# Patient Record
Sex: Female | Born: 1945 | Race: Asian | Hispanic: No | Marital: Married | Smoking: Never smoker
Health system: Southern US, Community
[De-identification: ages and names within clinical notes are randomized; demographics above are authoritative.]

## PROBLEM LIST (undated history)

## (undated) DIAGNOSIS — I10 Essential (primary) hypertension: Secondary | ICD-10-CM

## (undated) HISTORY — DX: Essential (primary) hypertension: I10

---

## 1998-07-28 ENCOUNTER — Ambulatory Visit (HOSPITAL_COMMUNITY): Admission: RE | Admit: 1998-07-28 | Discharge: 1998-07-28 | Payer: Self-pay | Admitting: Obstetrics and Gynecology

## 2000-07-25 ENCOUNTER — Encounter: Payer: Self-pay | Admitting: Obstetrics and Gynecology

## 2000-07-25 ENCOUNTER — Ambulatory Visit (HOSPITAL_COMMUNITY): Admission: RE | Admit: 2000-07-25 | Discharge: 2000-07-25 | Payer: Self-pay | Admitting: Obstetrics and Gynecology

## 2001-08-21 ENCOUNTER — Encounter: Payer: Self-pay | Admitting: Obstetrics and Gynecology

## 2001-08-21 ENCOUNTER — Ambulatory Visit (HOSPITAL_COMMUNITY): Admission: RE | Admit: 2001-08-21 | Discharge: 2001-08-21 | Payer: Self-pay | Admitting: Obstetrics and Gynecology

## 2003-03-18 ENCOUNTER — Encounter: Payer: Self-pay | Admitting: Obstetrics and Gynecology

## 2003-03-18 ENCOUNTER — Ambulatory Visit (HOSPITAL_COMMUNITY): Admission: RE | Admit: 2003-03-18 | Discharge: 2003-03-18 | Payer: Self-pay | Admitting: Obstetrics and Gynecology

## 2003-03-26 ENCOUNTER — Other Ambulatory Visit: Admission: RE | Admit: 2003-03-26 | Discharge: 2003-03-26 | Payer: Self-pay | Admitting: Gynecology

## 2004-07-14 ENCOUNTER — Ambulatory Visit (HOSPITAL_COMMUNITY): Admission: RE | Admit: 2004-07-14 | Discharge: 2004-07-14 | Payer: Self-pay | Admitting: Family Medicine

## 2005-08-30 ENCOUNTER — Ambulatory Visit (HOSPITAL_COMMUNITY): Admission: RE | Admit: 2005-08-30 | Discharge: 2005-08-30 | Payer: Self-pay | Admitting: Family Medicine

## 2005-09-06 ENCOUNTER — Other Ambulatory Visit: Admission: RE | Admit: 2005-09-06 | Discharge: 2005-09-06 | Payer: Self-pay | Admitting: Obstetrics and Gynecology

## 2006-09-12 ENCOUNTER — Ambulatory Visit (HOSPITAL_COMMUNITY): Admission: RE | Admit: 2006-09-12 | Discharge: 2006-09-12 | Payer: Self-pay | Admitting: Family Medicine

## 2006-09-26 ENCOUNTER — Encounter: Admission: RE | Admit: 2006-09-26 | Discharge: 2006-09-26 | Payer: Self-pay | Admitting: Otolaryngology

## 2007-10-23 ENCOUNTER — Ambulatory Visit (HOSPITAL_COMMUNITY): Admission: RE | Admit: 2007-10-23 | Discharge: 2007-10-23 | Payer: Self-pay | Admitting: Family Medicine

## 2007-10-24 ENCOUNTER — Other Ambulatory Visit: Admission: RE | Admit: 2007-10-24 | Discharge: 2007-10-24 | Payer: Self-pay | Admitting: Obstetrics and Gynecology

## 2007-11-04 ENCOUNTER — Encounter: Admission: RE | Admit: 2007-11-04 | Discharge: 2007-11-04 | Payer: Self-pay | Admitting: Family Medicine

## 2008-06-17 ENCOUNTER — Encounter: Admission: RE | Admit: 2008-06-17 | Discharge: 2008-08-04 | Payer: Self-pay | Admitting: Orthopedic Surgery

## 2008-12-02 ENCOUNTER — Ambulatory Visit (HOSPITAL_COMMUNITY): Admission: RE | Admit: 2008-12-02 | Discharge: 2008-12-02 | Payer: Self-pay | Admitting: Family Medicine

## 2009-09-15 ENCOUNTER — Ambulatory Visit: Payer: Self-pay | Admitting: Women's Health

## 2009-09-15 ENCOUNTER — Other Ambulatory Visit: Admission: RE | Admit: 2009-09-15 | Discharge: 2009-09-15 | Payer: Self-pay | Admitting: Gynecology

## 2009-12-14 ENCOUNTER — Ambulatory Visit (HOSPITAL_COMMUNITY): Admission: RE | Admit: 2009-12-14 | Discharge: 2009-12-14 | Payer: Self-pay | Admitting: Family Medicine

## 2010-10-30 ENCOUNTER — Encounter: Payer: Self-pay | Admitting: Family Medicine

## 2010-12-21 ENCOUNTER — Other Ambulatory Visit (HOSPITAL_COMMUNITY): Payer: Self-pay | Admitting: Family Medicine

## 2010-12-21 DIAGNOSIS — Z1231 Encounter for screening mammogram for malignant neoplasm of breast: Secondary | ICD-10-CM

## 2010-12-23 ENCOUNTER — Encounter (INDEPENDENT_AMBULATORY_CARE_PROVIDER_SITE_OTHER): Payer: Medicare PPO | Admitting: Women's Health

## 2010-12-23 DIAGNOSIS — N951 Menopausal and female climacteric states: Secondary | ICD-10-CM

## 2011-01-03 ENCOUNTER — Ambulatory Visit (HOSPITAL_COMMUNITY): Payer: Medicare PPO

## 2011-01-05 ENCOUNTER — Ambulatory Visit (HOSPITAL_COMMUNITY)
Admission: RE | Admit: 2011-01-05 | Discharge: 2011-01-05 | Disposition: A | Discharge status: Home / Self Care | Payer: Medicare PPO | Source: Ambulatory Visit | Attending: Family Medicine | Admitting: Family Medicine

## 2011-01-05 DIAGNOSIS — Z1231 Encounter for screening mammogram for malignant neoplasm of breast: Secondary | ICD-10-CM | POA: Insufficient documentation

## 2011-12-28 ENCOUNTER — Other Ambulatory Visit (HOSPITAL_COMMUNITY): Payer: Self-pay | Admitting: Family Medicine

## 2011-12-28 DIAGNOSIS — Z1231 Encounter for screening mammogram for malignant neoplasm of breast: Secondary | ICD-10-CM

## 2012-01-25 ENCOUNTER — Ambulatory Visit (HOSPITAL_COMMUNITY)
Admission: RE | Admit: 2012-01-25 | Discharge: 2012-01-25 | Disposition: A | Discharge status: Home / Self Care | Payer: Medicare PPO | Source: Ambulatory Visit | Attending: Family Medicine | Admitting: Family Medicine

## 2012-01-25 DIAGNOSIS — Z1231 Encounter for screening mammogram for malignant neoplasm of breast: Secondary | ICD-10-CM | POA: Insufficient documentation

## 2013-01-14 ENCOUNTER — Other Ambulatory Visit (HOSPITAL_COMMUNITY): Payer: Self-pay | Admitting: Family Medicine

## 2013-01-14 DIAGNOSIS — Z1231 Encounter for screening mammogram for malignant neoplasm of breast: Secondary | ICD-10-CM

## 2013-02-04 ENCOUNTER — Ambulatory Visit (HOSPITAL_COMMUNITY): Payer: Medicare PPO

## 2013-02-20 ENCOUNTER — Ambulatory Visit (HOSPITAL_COMMUNITY)
Admission: RE | Admit: 2013-02-20 | Discharge: 2013-02-20 | Disposition: A | Discharge status: Home / Self Care | Payer: Medicare PPO | Source: Ambulatory Visit | Attending: Family Medicine | Admitting: Family Medicine

## 2013-02-20 DIAGNOSIS — Z1231 Encounter for screening mammogram for malignant neoplasm of breast: Secondary | ICD-10-CM | POA: Insufficient documentation

## 2014-03-05 ENCOUNTER — Other Ambulatory Visit (HOSPITAL_COMMUNITY): Payer: Self-pay | Admitting: Family Medicine

## 2014-03-05 DIAGNOSIS — Z1231 Encounter for screening mammogram for malignant neoplasm of breast: Secondary | ICD-10-CM

## 2014-03-17 ENCOUNTER — Ambulatory Visit (HOSPITAL_COMMUNITY): Payer: Medicare PPO

## 2014-04-07 ENCOUNTER — Ambulatory Visit (HOSPITAL_COMMUNITY)
Admission: RE | Admit: 2014-04-07 | Discharge: 2014-04-07 | Disposition: A | Discharge status: Home / Self Care | Payer: Medicare PPO | Source: Ambulatory Visit | Attending: Family Medicine | Admitting: Family Medicine

## 2014-04-07 DIAGNOSIS — Z1231 Encounter for screening mammogram for malignant neoplasm of breast: Secondary | ICD-10-CM | POA: Insufficient documentation

## 2015-04-26 ENCOUNTER — Other Ambulatory Visit (HOSPITAL_COMMUNITY): Payer: Self-pay | Admitting: Family Medicine

## 2015-04-26 DIAGNOSIS — Z1231 Encounter for screening mammogram for malignant neoplasm of breast: Secondary | ICD-10-CM

## 2015-05-11 ENCOUNTER — Ambulatory Visit (HOSPITAL_COMMUNITY)
Admission: RE | Admit: 2015-05-11 | Discharge: 2015-05-11 | Disposition: A | Discharge status: Home / Self Care | Payer: Medicare PPO | Source: Ambulatory Visit | Attending: Family Medicine | Admitting: Family Medicine

## 2015-05-11 DIAGNOSIS — Z1231 Encounter for screening mammogram for malignant neoplasm of breast: Secondary | ICD-10-CM | POA: Insufficient documentation

## 2015-06-01 DIAGNOSIS — I10 Essential (primary) hypertension: Secondary | ICD-10-CM | POA: Diagnosis not present

## 2015-06-01 DIAGNOSIS — E782 Mixed hyperlipidemia: Secondary | ICD-10-CM | POA: Diagnosis not present

## 2015-07-06 DIAGNOSIS — R7309 Other abnormal glucose: Secondary | ICD-10-CM | POA: Diagnosis not present

## 2015-12-29 ENCOUNTER — Ambulatory Visit (INDEPENDENT_AMBULATORY_CARE_PROVIDER_SITE_OTHER): Payer: Medicare PPO | Admitting: Family Medicine

## 2015-12-29 ENCOUNTER — Encounter: Payer: Self-pay | Admitting: Family Medicine

## 2015-12-29 ENCOUNTER — Other Ambulatory Visit (INDEPENDENT_AMBULATORY_CARE_PROVIDER_SITE_OTHER): Payer: Medicare PPO

## 2015-12-29 VITALS — BP 128/82 | HR 62 | Wt 121.0 lb

## 2015-12-29 DIAGNOSIS — M7551 Bursitis of right shoulder: Secondary | ICD-10-CM | POA: Diagnosis not present

## 2015-12-29 DIAGNOSIS — M25511 Pain in right shoulder: Secondary | ICD-10-CM

## 2015-12-29 NOTE — Patient Instructions (Signed)
Good to see you.  Ice 20 minutes 2 times daily. Usually after activity and before bed. Exercises 3 times a week.  pennsaid pinkie amount topically 2 times daily as needed.  Avoid activity with hand out of peripheral vision  Vitamin D 2000 IU daily  Continue the turmeric See me again in 3-4 weeks and if not better we will try ultrasound and injection or physical therapy  Tell your husband hello!

## 2015-12-29 NOTE — Assessment & Plan Note (Signed)
Patient does have bursitis of the right shoulder. We discussed icing regimen, home exercises, patient work with Event organiserathletic trainer. Patient even trial topical anti-inflammatory's. We discussed which activities to avoid. Patient and will come back and see me again in 3-4 weeks. At that time if worsening symptoms we'll consider ultrasound as well as possible injection. Patient would also be a candidate for formal physical therapy.

## 2015-12-29 NOTE — Progress Notes (Signed)
Pre visit review using our clinic review tool, if applicable. No additional management support is needed unless otherwise documented below in the visit note. 

## 2015-12-29 NOTE — Progress Notes (Signed)
Patricia Nielsen D.O. [facility] [address] [place], [place] [address] Phone: [phone] Subjective:     CC: right shoulder pain  BJY:NWGNFAOZHYHPI:Subjective Patricia Nielsen is a 70 y.o. female coming in with complaint of complaint of right shoulder pain. Patient describes it as a dull, throbbing aching sensation that is been going on for 3-4 months. Patient states she does not remember any true injury. States that reaching behind her back seems to be the most painful. Can wake her up at night. Mild radiation down the arm. No associated neck pain. No significant weakness. Patient rates the severity of pain as 4 out of 10. Patient is concerned because is seems to be worsening and not getting better. Has not responded well to any home modalities.     No past medical history on file.no history of any orthopedic complaints, I cholesterol, hypertension No past surgical history on file. Social History   Social History  . Marital Status: Married    Spouse Name: N/A  . Number of Children: N/A  . Years of Education: N/A   Social History Main Topics  . Smoking status: Never Smoker   . Smokeless tobacco: None  . Alcohol Use: None  . Drug Use: None  . Sexual Activity: Not Asked   Other Topics Concern  . None   Social History Narrative  . None   Not on File NKDA No family history on file. no family history of rheumatological diseases Past medical history, social, surgical and family history all reviewed in electronic medical record.  No pertanent information unless stated regarding to the chief complaint.   Review of Systems: No headache, visual changes, nausea, vomiting, diarrhea, constipation, dizziness, abdominal pain, skin rash, fevers, chills, night sweats, weight loss, swollen lymph nodes, body aches, joint swelling, muscle aches, chest pain, shortness of breath, mood changes.   Objective Blood pressure 128/82, pulse 62, weight 121 lb (54.885 kg), SpO2 97 %.  General: No apparent  distress alert and oriented x3 mood and affect normal, dressed appropriately.  HEENT: Pupils equal, extraocular movements intact  Respiratory: Patient's speak in full sentences and does not appear short of breath  Cardiovascular: No lower extremity edema, non tender, no erythema  Skin: Warm dry intact with no signs of infection or rash on extremities or on axial skeleton.  Abdomen: Soft nontender  Neuro: Cranial nerves II through XII are intact, neurovascularly intact in all extremities with 2+ DTRs and 2+ pulses.  Lymph: No lymphadenopathy of posterior or anterior cervical chain or axillae bilaterally.  Gait normal with good balance and coordination.  MSK:  Non tender with full range of motion and good stability and symmetric strength and tone of , elbows, wrist, hip, knee and ankles bilaterally.  Shoulder: Right Inspection reveals no abnormalities, atrophy or asymmetry. Palpation is normal with no tenderness over AC joint or bicipital groove. ROM is full in all planes passively. Rotator cuff strength normal throughout. signs of impingement with positive Neer and Hawkin's tests, but negative empty can sign. Speeds and Yergason's tests normal. No labral pathology noted with negative Obrien's, negative clunk and good stability. Normal scapular function observed. No painful arc and no drop arm sign. No apprehension sign  Contralateral shoulder unremarkable  Procedure note 97110; 15 minutes spent for Therapeutic exercises as stated in above notes.  This included exercises focusing on stretching, strengthening, with significant focus on eccentric aspects.  Basic scapular stabilization to include adduction and depression of scapula Scaption, focusing on proper movement  and good control Internal and External rotation utilizing a theraband, with elbow tucked at side entire time Rows with theraba Proper technique shown and discussed handout in great detail with ATC.  All questions were discussed  and answered.     Impression and Recommendations:     This case required medical decision making of moderate complexity.      Note: This dictation was prepared with Dragon dictation along with smaller phrase technology. Any transcriptional errors that result from this process are unintentional.

## 2016-01-26 ENCOUNTER — Ambulatory Visit (INDEPENDENT_AMBULATORY_CARE_PROVIDER_SITE_OTHER): Payer: Medicare PPO | Admitting: Family Medicine

## 2016-01-26 ENCOUNTER — Encounter: Payer: Self-pay | Admitting: Family Medicine

## 2016-01-26 VITALS — BP 122/78 | HR 59 | Wt 122.0 lb

## 2016-01-26 DIAGNOSIS — M7551 Bursitis of right shoulder: Secondary | ICD-10-CM

## 2016-01-26 NOTE — Patient Instructions (Signed)
Good to see you  Ice 20 minutes 2 times daily. Usually after activity and before bed. Continue to move but no heavy lifting.  Try the pennsaid again up to 2 times a day  Continue the vitamins Physical therapy will be calling you  See me again in 4-6 weeks.

## 2016-01-26 NOTE — Progress Notes (Signed)
  Tawana ScaleZach Smith D.O. [facility] [address] [place], [place] [address] Phone: [phone]) [phone] Subjective:     CC: right shoulder pain  BJY:NWGNFAOZHYHPI:Subjective Patricia Nielsen is a 70 y.o. female coming in with complaint of complaint of right shoulder pain. Patient was found to have more of a shoulder bursitis. Patient encouraged to do home exercises, icing, topical anti-inflammatories as well as over-the-counter vitamins. Patient has been doing [place] fairly frequently. Has noticed some mild improvement. States that she is having more good days than bad days. Seems to feel better when she is doing activities and when she is sitting around. Denies as much nighttime awakening.     No past medical history on file.no history of any orthopedic complaints, I cholesterol, hypertension No past surgical history on file. Social History   Social History  . Marital Status: Married    Spouse Name: N/A  . Number of Children: N/A  . Years of Education: N/A   Social History Main Topics  . Smoking status: Never Smoker   . Smokeless tobacco: None  . Alcohol Use: None  . Drug Use: None  . Sexual Activity: Not Asked   Other Topics Concern  . None   Social History Narrative   Not on File NKDA No family history on file. no family history of rheumatological diseases Past medical history, social, surgical and family history all reviewed in electronic medical record.  No pertanent information unless stated regarding to the chief complaint.   Review of Systems: No headache, visual changes, nausea, vomiting, diarrhea, constipation, dizziness, abdominal pain, skin rash, fevers, chills, night sweats, weight loss, swollen lymph nodes, body aches, joint swelling, muscle aches, chest pain, shortness of breath, mood changes.   Objective Blood pressure 122/78, pulse 59, weight 122 lb (55.339 kg), SpO2 97 %.  General: No apparent distress alert and oriented x3 mood and affect normal, dressed appropriately.    HEENT: Pupils equal, extraocular movements intact  Respiratory: Patient's speak in full sentences and does not appear short of breath  Cardiovascular: No lower extremity edema, non tender, no erythema  Skin: Warm dry intact with no signs of infection or rash on extremities or on axial skeleton.  Abdomen: Soft nontender  Neuro: Cranial nerves II through XII are intact, neurovascularly intact in all extremities with 2+ DTRs and 2+ pulses.  Lymph: No lymphadenopathy of posterior or anterior cervical chain or axillae bilaterally.  Gait normal with good balance and coordination.  MSK:  Non tender with full range of motion and good stability and symmetric strength and tone of , elbows, wrist, hip, knee and ankles bilaterally.  Shoulder: Right Inspection reveals no abnormalities, atrophy or asymmetry. Palpation is normal with no tenderness over AC joint or bicipital groove. ROM is full in all planes passively. Rotator cuff strength normal throughout. Mild impingement still remaining. Mild improvement from previous exam Speeds and Yergason's tests normal. No labral pathology noted with negative Obrien's, negative clunk and good stability. Normal scapular function observed. No painful arc and no drop arm sign. No apprehension sign  Contralateral shoulder unremarkable       Impression and Recommendations:     This case required medical decision making of moderate complexity.      Note: This dictation was prepared with Dragon dictation along with smaller phrase technology. Any transcriptional errors that result from this process are unintentional.

## 2016-01-26 NOTE — Assessment & Plan Note (Signed)
Seem stable versus very minimal improvement. At this point I do not think that she has made great strides. We will get her into formal physical therapy. Patient declined injection today. Patient given a trial topical anti-inflammatories. We discussed which activities to potentially avoid. Patient will continue with the icing protocol. We'll see patient back again in 4-6 weeks. At that time if continuing have pain I would like to try injection.signs weakness in the concern for rotator cuff tear or any labral pathology at this moment.  Spent  25 minutes with patient face-to-face and had greater than 50% of counseling including as described above in assessment and plan.

## 2016-01-26 NOTE — Progress Notes (Signed)
Pre visit review using our clinic review tool, if applicable. No additional management support is needed unless otherwise documented below in the visit note. 

## 2016-02-04 ENCOUNTER — Ambulatory Visit: Payer: Medicare PPO | Admitting: Physical Therapy

## 2016-02-09 ENCOUNTER — Encounter: Payer: Self-pay | Admitting: Physical Therapy

## 2016-02-09 ENCOUNTER — Ambulatory Visit: Payer: Medicare PPO | Attending: Family Medicine | Admitting: Physical Therapy

## 2016-02-09 DIAGNOSIS — M25511 Pain in right shoulder: Secondary | ICD-10-CM

## 2016-02-09 DIAGNOSIS — M25611 Stiffness of right shoulder, not elsewhere classified: Secondary | ICD-10-CM | POA: Insufficient documentation

## 2016-02-09 DIAGNOSIS — R293 Abnormal posture: Secondary | ICD-10-CM | POA: Insufficient documentation

## 2016-02-09 NOTE — Therapy (Addendum)
[facility] [address] [place], [place], [address] Phone: [phone]   Fax:  [phone]  Physical Therapy Evaluation  Patient Details  Name: Patricia Nielsen MRN: [id number] Date of Birth: [birth year] Referring Provider: Antoine Primas MD,  Encounter Date: 02/09/2016      PT End of Session - 02/09/16 1012    Visit Number 1   Number of Visits 13   Date for PT Re-Evaluation 03/22/16   Authorization Type Medicare: Kx mod by 15th visit, Progress note by 10th visit   PT Start Time 0935   PT Stop Time 1016   PT Time Calculation (min) 41 min   Activity Tolerance Patient tolerated treatment well   Behavior During Therapy [facility] for tasks assessed/performed      Past Medical History  Diagnosis Date  . Hypertension     No past surgical history on file.  There were no vitals filed for this visit.       Subjective Assessment - 02/09/16 0942    Subjective pt is a 70 y.o F with CC of R shoulder pain that has been going on for about 2-3 months that gradually got worse with non-traumatic injury. Pain fluctuates and typically stays in the shoulder but occasionally it radiates down the elbow.  since onset the pain seems to fluctuate and seems to stay the same.    How long can you sit comfortably? unlimited   How long can you stand comfortably? unlimited   How long can you walk comfortably? unlimited   Diagnostic tests 12/29/2015 [place] on R shoulder (pt reports not remember having an [place] on her shoulder)   Patient Stated Goals to decrease pain, to be able to reach behind the back better,    Currently in Pain? Yes   Pain Score 6   with reaching behind back    Pain Location Shoulder   Pain Orientation Right   Pain Descriptors / Indicators Aching;Sore   Pain Type Chronic pain   Pain Radiating Towards intermittently to the R elbow   Pain Onset More than a month ago   Pain Frequency Intermittent   Aggravating Factors  reaching behind the  back, and internal roation   Pain Relieving Factors resting, and avoiding the motion   Effect of Pain on Daily Activities limited mobility with behind the back            [facility] PT Assessment - 02/09/16 0948    Assessment   Medical Diagnosis R shoulder Bursitis   Referring Provider Antoine Primas MD,   Onset Date/Surgical Date --  2 - 3 months   Hand Dominance Right   Next MD Visit 4 weeks   Prior Therapy yes   Precautions   Precaution Comments no heavy lifting overhead   Restrictions   Weight Bearing Restrictions No   Balance Screen   Has the patient fallen in the past 6 months No   Has the patient had a decrease in activity level because of a fear of falling?  No   Is the patient reluctant to leave their home because of a fear of falling?  No   Home Environment   Living Environment Private residence   Living Arrangements Spouse/significant other   Available Help at Discharge Available PRN/intermittently;Available 24 hours/day   Type of Home House   Home Access Level entry   Home Layout One level   Prior Function   Level of Independence Independent;Independent with basic ADLs   Vocation Retired  Leisure gardening, walking, staying active   Cognition   Overall Cognitive Status Within Functional Limits for tasks assessed   Observation/Other Assessments   Focus on Therapeutic Outcomes (FOTO)  19% limited   Posture/Postural Control   Posture/Postural Control Postural limitations   Postural Limitations Rounded Shoulders;Forward head   ROM / Strength   AROM / PROM / Strength AROM;PROM;Strength   AROM   Overall AROM Comments flexion/ abduction equal bil WFL   AROM Assessment Site Shoulder   Right/Left Shoulder Right;Left   Right Shoulder Internal Rotation 55 Degrees   Right Shoulder External Rotation 78 Degrees  ERP   Left Shoulder Internal Rotation 82 Degrees   Left Shoulder External Rotation 78 Degrees   PROM   Overall PROM  Within functional limits for tasks  performed   Strength   Strength Assessment Site Shoulder;Hand   Right/Left Shoulder Right;Left   Right Shoulder Flexion 4/5   Right Shoulder ABduction 4/5  pain during testing   Right Shoulder Internal Rotation 4/5   Right Shoulder External Rotation 4/5   Left Shoulder Flexion 4/5   Left Shoulder ABduction 4/5   Left Shoulder Internal Rotation 4/5   Left Shoulder External Rotation 4/5   Palpation   Palpation comment tendnerness at the supraspinatus and upper trap tightness on the R   Special Tests   Rotator Cuff Impingment tests Leanord AsalHawkins- Kennedy test;Painful Arc of Motion;Full Can test;Empty Can test;other   Hawkins-Kennedy test   Findings Positive   Side Right   Empty Can test   Findings Negative   Full Can test   Findings Negative   Painful Arc of Motion   Findings Positive   Side Right   other   Findings Positive   Comments scapular assist test                             PT Short Term Goals - 02/09/16 1058    PT SHORT TERM GOAL #1   Title pt will be I with inital HEP (03/01/2016)   Time 3   Period Weeks   Status New   PT SHORT TERM GOAL #2   Title pt will be able to demosntrate proper posture/ lifting and carrying mechanics to decrease pain and prevent R shoulder reinjury (03/01/2016)   Time 3   Period Weeks   Status New           PT Long Term Goals - 02/09/16 1059    PT LONG TERM GOAL #1   Title pt will be I with all HEP as of last visit (03/22/2016)   Time 6   Period Weeks   Status New   PT LONG TERM GOAL #2   Title pt will improve R shoulder internal rotation by >/= 10 degrees with </=2/10 pain to assist with dressing and ADLS (03/22/2016)   Time 6   Period Weeks   Status New   PT LONG TERM GOAL #3   Title pt will improve her R shoulder strength to >/=4+/5 with </= 1/10 pain to assist with lifting and carrying activities with gardening (03/22/2016)   Time 6   Period Weeks   Status New   PT LONG TERM GOAL #4   Title pt will  improve her FOTO score by >/= 5 points to demonstrate improvement in function at discharge (03/22/2016)   Time 4   Period Weeks   Status New       g-code: Carrying, moving  and handling objects Foto/clinical judgement current status: CJ goal status: CI         Plan - 02/09/16 1039    Clinical Impression Statement Mrs. Grosch presents to OPPT as a low complexity evaluation with CC of non-traumatic R shoulder pain that started 2-3 months ago. She exhibits R shoulder funcitonal AROM in all planes except for internal rotation with ERP compared bil. Special testing was positive with scapular assist, hawkins kennedy, and painful arc testing which in combination with subjective history demonstrates high likelihood of R shoulder impingement syndrome. She would benefit from physical therapy to decrease pain, improve strength and mobility and return pt to her PLOF by addressing the impairments listed.    Rehab Potential Good   PT Frequency 2x / week   PT Duration 6 weeks   PT Next Visit Plan assess/ review HEP, scapular stability exercises, rotator cuff strengthening   PT Home Exercise Plan Rows, ceiling punches, shoulder IR/ ER, upper trap stretch,    Consulted and Agree with Plan of Care Patient      Patient will benefit from skilled therapeutic intervention in order to improve the following deficits and impairments:  Increased muscle spasms, Impaired UE functional use, Improper body mechanics, Postural dysfunction, Decreased strength, Increased fascial restricitons, Decreased range of motion, Decreased activity tolerance, Decreased endurance  Visit Diagnosis: Pain in right shoulder - Plan: PT plan of care cert/re-cert  Stiffness of right shoulder, not elsewhere classified - Plan: PT plan of care cert/re-cert  Abnormal posture - Plan: PT plan of care cert/re-cert     Problem List Patient Active Problem List   Diagnosis Date Noted  . Bursitis of right shoulder 12/29/2015    Lulu Riding PT, DPT, LAT, ATC  02/09/2016  11:55 AM      [facility] [address] [place], [place], [address] Phone: [phone]   Fax:  [phone]  Name: Patricia Nielsen MRN: [id number] Date of Birth: Aug 10, 1946

## 2016-02-16 ENCOUNTER — Ambulatory Visit: Payer: Medicare PPO | Admitting: Physical Therapy

## 2016-02-16 DIAGNOSIS — R293 Abnormal posture: Secondary | ICD-10-CM | POA: Diagnosis not present

## 2016-02-16 DIAGNOSIS — M25611 Stiffness of right shoulder, not elsewhere classified: Secondary | ICD-10-CM | POA: Diagnosis not present

## 2016-02-16 DIAGNOSIS — M25511 Pain in right shoulder: Secondary | ICD-10-CM

## 2016-02-16 NOTE — Therapy (Signed)
[place], [place], [address] Phone: [phone]   Fax:  [phone]  Physical Therapy Treatment  Patient Details  Name: Patricia Nielsen MRN: [id number] Date of Birth: [birth year] Referring Provider: Hulan Saas MD,  Encounter Date: 02/16/2016      PT End of Session - 02/16/16 1015    Visit Number 2   Number of Visits 13   Date for PT Re-Evaluation 03/22/16   PT Start Time 0930   PT Stop Time 1010   PT Time Calculation (min) 40 min   Activity Tolerance Patient tolerated treatment well;No increased pain   Behavior During Therapy [facility] for tasks assessed/performed      Past Medical History  Diagnosis Date  . Hypertension     No past surgical history on file.  There were no vitals filed for this visit.      Subjective Assessment - 02/16/16 0932    Subjective 6/10.     Currently in Pain? Yes   Pain Score 3   0 to 6/10   Pain Location Shoulder   Pain Orientation Right   Pain Descriptors / Indicators Aching;Sore   Pain Type Chronic pain   Pain Radiating Towards sometimes to wrist,  once a week depends on what she does   Pain Frequency Several days a week   Aggravating Factors  mowing grass   Pain Relieving Factors rest                         [facility] Adult PT Treatment/Exercise - 02/16/16 0001    Therapeutic Activites    Therapeutic Activities --  practice supine to sit to supine  without pressing elbow   Shoulder Exercises: Supine   Protraction Limitations 10 X 2, 1 setr with 2 pounds   Shoulder Exercises: Standing   Flexion 10 reps  full flexion with cane   Row 10 reps   Theraband Level (Shoulder Row) Level 2 (Red)   Shoulder Elevation 10 reps  scaption to 90, AROM   Other Standing Exercises shrugs with 2 pounds in each, 10 x,   Other Standing Exercises pendelum hang with circles , 2 pounds 1 minute each direction   Shoulder Exercises: ROM/Strengthening   Other  ROM/Strengthening Exercises Red band ER/IR , punch and retraction, red band 10 X each,  rolled towel used with rotations   Other ROM/Strengthening Exercises standing cane stretch 10 x flexion   Shoulder Exercises: Stretch   Cross Chest Stretch 3 reps;30 seconds  right   Neck Exercises: Stretches   Upper Trapezius Stretch 3 reps;30 seconds  cues                PT Education - 02/16/16 1014    Education provided Yes   Education Details getting in/ out of bed without digging elbow into bed   Person(s) Educated Patient   Methods Explanation;Demonstration;Verbal cues   Comprehension Verbalized understanding;Returned demonstration          PT Short Term Goals - 02/16/16 1023    PT SHORT TERM GOAL #1   Title pt will be I with inital HEP (03/01/2016)   Time 3   Period Weeks   Status On-going   PT SHORT TERM GOAL #2   Title pt will be able to demosntrate proper posture/ lifting and carrying mechanics to decrease pain and prevent R shoulder reinjury (03/01/2016)   Time 3   Period Weeks   Status On-going  PT Long Term Goals - 02/09/16 1059    PT LONG TERM GOAL #1   Title pt will be I with all HEP as of last visit (03/22/2016)   Time 6   Period Weeks   Status New   PT LONG TERM GOAL #2   Title pt will improve R shoulder internal rotation by >/= 10 degrees with </=2/10 pain to assist with dressing and ADLS (6/64/4034)   Time 6   Period Weeks   Status New   PT LONG TERM GOAL #3   Title pt will improve her R shoulder strength to >/=4+/5 with </= 1/10 pain to assist with lifting and carrying activities with gardening (03/22/2016)   Time 6   Period Weeks   Status New   PT LONG TERM GOAL #4   Title pt will improve her FOTO score by >/= 5 points to demonstrate improvement in function at discharge (03/22/2016)   Time 4   Period Weeks   Status New               Plan - 02/16/16 1016    Clinical Impression Statement Patient did not have band to do her  exercises.  Red band issued and patient needed moderate cues.  No new goals met   PT Next Visit Plan review home exercises,  scapular stabilization   PT Home Exercise Plan Rows, ceiling punches, shoulder IR/ ER, upper trap stretch,    Consulted and Agree with Plan of Care Patient      Patient will benefit from skilled therapeutic intervention in order to improve the following deficits and impairments:  Increased muscle spasms, Impaired UE functional use, Improper body mechanics, Postural dysfunction, Decreased strength, Increased fascial restricitons, Decreased range of motion, Decreased activity tolerance, Decreased endurance  Visit Diagnosis: Pain in right shoulder  Stiffness of right shoulder, not elsewhere classified  Abnormal posture     Problem List Patient Active Problem List   Diagnosis Date Noted  . Bursitis of right shoulder 12/29/2015    [facility] 02/16/2016, 10:25 AM  [place], [place], [address] Phone: [phone]   Fax:  [phone]  Name: Patricia Nielsen MRN: [id number] Date of Birth: 30-Jun-1946    Melvenia Needles, PTA 02/16/2016 10:25 AM Phone: [phone] Fax: [phone]

## 2016-02-18 ENCOUNTER — Ambulatory Visit: Payer: Medicare PPO | Admitting: Physical Therapy

## 2016-02-21 ENCOUNTER — Ambulatory Visit: Payer: Medicare PPO | Admitting: Physical Therapy

## 2016-02-21 DIAGNOSIS — R293 Abnormal posture: Secondary | ICD-10-CM | POA: Diagnosis not present

## 2016-02-21 DIAGNOSIS — M25611 Stiffness of right shoulder, not elsewhere classified: Secondary | ICD-10-CM

## 2016-02-21 DIAGNOSIS — M25511 Pain in right shoulder: Secondary | ICD-10-CM

## 2016-02-21 NOTE — Therapy (Signed)
[facility] [address] [place], [place], [address] Phone: [phone]   Fax:  [phone]  Physical Therapy Treatment  Patient Details  Name: Patricia BushyHisae K Nielsen MRN: [id number] Date of Birth: Nov 20, 1945 Referring Provider: Antoine PrimasZachary Smith MD,  Encounter Date: 02/21/2016      PT End of Session - 02/21/16 0847    Visit Number 3   Number of Visits 13   Date for PT Re-Evaluation 03/22/16   Authorization Type Medicare: Kx mod by 15th visit, Progress note by 10th visit   PT Start Time 0847   PT Stop Time 0926   PT Time Calculation (min) 39 min   Activity Tolerance Patient tolerated treatment well   Behavior [facility] Therapy [facility] for tasks assessed/performed      Past Medical History  Diagnosis Date  . Hypertension     No past surgical history on file.  There were no vitals filed for this visit.      Subjective Assessment - 02/21/16 0848    Subjective being consistent with HEP, but still feeling about the same   Currently in Pain? Yes   Pain Score 4    Pain Location Shoulder   Pain Orientation Right   Pain Descriptors / Indicators Aching;Sore   Pain Type Chronic pain   Aggravating Factors  vacuuming, cleaning the floor   Pain Relieving Factors moving it around, ice                         [facility] - 02/21/16 0919    Self-Care   Self-Care Posture   Posture keeping the shoulder down, avoiding hiking to decrease tightness and over activation of the R upper trap    Shoulder Exercises: Supine   Protraction AROM;Strengthening;Right;10 reps  2 sets   Protraction Weight (lbs) 4   Shoulder Exercises: Seated   Extension AROM;Strengthening;Right;10 reps  2 set s    Theraband Level (Shoulder Extension) Level 3 (Green)   Row AROM;Strengthening;Right;10 reps  2 sets    Theraband Level (Shoulder Row) Level 3 (Green)   Shoulder Exercises: Stretch   Other Shoulder Stretches 2 x 30 sec  upper trap stretch   Other Shoulder Stretches 2 x 30 sec rhomboid stretch   Manual Therapy   Manual Therapy Scapular mobilization   Manual therapy comments 4x manual trigger point release of R upper trap  pt reports no pain following manual   Scapular Mobilization grade 3 scapular mobs in all planes with focus on upward rotation for scapulohumeral rhythm                PT Education - 02/21/16 0926    Education provided Yes   Education Details posture regarding avoiding shoulder hiking, and manual trigger point release to decrease tension in R upper trap   Person(s) Educated Patient   Methods Explanation   Comprehension Verbalized understanding          PT Short Term Goals - 02/16/16 1023    PT SHORT TERM GOAL #1   Title pt will be I with inital HEP (03/01/2016)   Time 3   Period Weeks   Status On-going   PT SHORT TERM GOAL #2   Title pt will be able to demosntrate proper posture/ lifting and carrying mechanics to decrease pain and prevent R shoulder reinjury (03/01/2016)   Time 3   Period Weeks   Status On-going  PT Long Term Goals - 02/09/16 1059    PT LONG TERM GOAL #1   Title pt will be I with all HEP as of last visit (03/22/2016)   Time 6   Period Weeks   Status New   PT LONG TERM GOAL #2   Title pt will improve R shoulder internal rotation by >/= 10 degrees with </=2/10 pain to assist with dressing and ADLS (03/22/2016)   Time 6   Period Weeks   Status New   PT LONG TERM GOAL #3   Title pt will improve her R shoulder strength to >/=4+/5 with </= 1/10 pain to assist with lifting and carrying activities with gardening (03/22/2016)   Time 6   Period Weeks   Status New   PT LONG TERM GOAL #4   Title pt will improve her FOTO score by >/= 5 points to demonstrate improvement in function at discharge (03/22/2016)   Time 4   Period Weeks   Status New               Plan - 02/21/16 1610    Clinical Impression Statement Patricia Nielsen reported  4/10 pain initially, following manual and stretching she stated she had no pain. She was able to proform all exercises without report of pain. Following todays session she was able to abduct her R should through full available motion with no report of pain. Educated about avoid hiking of the shoulder. pt declined ice following todays session.    Consulted and Agree with Plan of Care Patient      Patient will benefit from skilled therapeutic intervention in order to improve the following deficits and impairments:  Increased muscle spasms, Impaired UE functional use, Improper body mechanics, Postural dysfunction, Decreased strength, Increased fascial restricitons, Decreased range of motion, Decreased activity tolerance, Decreased endurance  Visit Diagnosis: Pain in right shoulder  Stiffness of right shoulder, not elsewhere classified  Abnormal posture     Problem List Patient Active Problem List   Diagnosis Date Noted  . Bursitis of right shoulder 12/29/2015   Patricia Nielsen PT, DPT, LAT, ATC  02/21/2016  9:29 AM      [facility] [address] [place], [place], [address] Phone: [phone]   Fax:  [phone]  Name: Patricia Nielsen MRN: [id number] Date of Birth: November 17, 1945

## 2016-02-23 ENCOUNTER — Ambulatory Visit: Payer: Medicare PPO | Admitting: Physical Therapy

## 2016-02-23 DIAGNOSIS — M25611 Stiffness of right shoulder, not elsewhere classified: Secondary | ICD-10-CM | POA: Diagnosis not present

## 2016-02-23 DIAGNOSIS — R293 Abnormal posture: Secondary | ICD-10-CM

## 2016-02-23 DIAGNOSIS — M25511 Pain in right shoulder: Secondary | ICD-10-CM

## 2016-02-23 NOTE — Therapy (Signed)
[place], [place], [address] Phone: [phone]   Fax:  [phone]  Physical Therapy Treatment  Patient Details  Name: Patricia Nielsen MRN: [id number] Date of Birth: [birth year] Referring Provider: Hulan Saas MD,  Encounter Date: 02/23/2016      PT End of Session - 02/23/16 0929    Visit Number 4   Number of Visits 13   Date for PT Re-Evaluation 03/22/16   Authorization Type Medicare: Kx mod by 15th visit, Progress note by 10th visit   PT Start Time 0845   PT Stop Time 0928   PT Time Calculation (min) 43 min   Activity Tolerance Patient tolerated treatment well   Behavior During Therapy Patricia Nielsen for tasks assessed/performed      Past Medical History  Diagnosis Date  . Hypertension     No past surgical history on file.  There were no vitals filed for this visit.      Subjective Assessment - 02/23/16 0847    Subjective Todays a good day, I feel it but theres no pain.   Currently in Pain? No/denies                         [facility] Adult PT Treatment/Exercise - 02/23/16 0848    Shoulder Exercises: Supine   Other Supine Exercises chin tuck 1 x 8, modified to supine for form and mechanics   Other Supine Exercises shoulder circles 2 x 20 forward/ backward   reported burning in the muscle but no pain   Shoulder Exercises: Seated   Other Seated Exercises 2 x 10 retraction with external rotation  red theraband   Shoulder Exercises: Prone   Horizontal ABduction 1 AROM;Strengthening;Both;10 reps;Theraband  2 sets with bolster between shoulder blades   Other Prone Exercises I's, T's, Y's RUE only 2 x 10 with 1#  cues for form and slow controlled motion   Shoulder Exercises: Standing   Extension AROM;Strengthening;15 reps;Theraband   Theraband Level (Shoulder Extension) Level 3 (Green)   Row AROM;Strengthening;15 reps;Theraband   Theraband Level (Shoulder Row) Level 3 (Green)   Shoulder Exercises: ROM/Strengthening   UBE (Upper Arm Bike) L1 x 5 min  changing direction at 2:30 sec   Shoulder Exercises: Stretch   Other Shoulder Stretches 2 x 30 sec upper trap stretch   Other Shoulder Stretches 2 x 30 sec rhomboid stretch, and levator scapulae                 PT Education - 02/23/16 0929    Education provided Yes   Education Details updated HEP   Person(s) Educated Patient   Methods Explanation   Comprehension Verbalized understanding          PT Short Term Goals - 02/23/16 0932    PT SHORT TERM GOAL #1   Title pt will be I with inital HEP (03/01/2016)   Time 3   Period Weeks   Status Achieved   PT SHORT TERM GOAL #2   Title pt will be able to demosntrate proper posture/ lifting and carrying mechanics to decrease pain and prevent R shoulder reinjury (03/01/2016)   Time 3   Period Weeks   Status Achieved           PT Long Term Goals - 02/09/16 1059    PT LONG TERM GOAL #1   Title pt will be I with all HEP as of last visit (03/22/2016)   Time 6  Period Weeks   Status New   PT LONG TERM GOAL #2   Title pt will improve R shoulder internal rotation by >/= 10 degrees with </=2/10 pain to assist with dressing and ADLS (2/89/0228)   Time 6   Period Weeks   Status New   PT LONG TERM GOAL #3   Title pt will improve her R shoulder strength to >/=4+/5 with </= 1/10 pain to assist with lifting and carrying activities with gardening (03/22/2016)   Time 6   Period Weeks   Status New   PT LONG TERM GOAL #4   Title pt will improve her FOTO score by >/= 5 points to demonstrate improvement in function at discharge (03/22/2016)   Time [place] - 02/23/16 0929    Clinical Impression Statement Patricia Nielsen continues to make progress with therapy reporting no pain today. Focsued on strengthening and scapular stability which she performed well with only reproting burning on the muscle. updated HEP to  include horizontal abduction, external rotation with retraction and chin tucks.  she met all STG today.    PT Next Visit Plan shoulder stretching, scapular stabilizers, RC  review goals, measure progress   PT Home Exercise Plan shoulder retraction with external rotation, horizontal abuction, chin tucks   Consulted and Agree with Plan of Care Patient      Patient will benefit from skilled therapeutic intervention in order to improve the following deficits and impairments:  Increased muscle spasms, Impaired UE functional use, Improper body mechanics, Postural dysfunction, Decreased strength, Increased fascial restricitons, Decreased range of motion, Decreased activity tolerance, Decreased endurance  Visit Diagnosis: Pain in right shoulder  Stiffness of right shoulder, not elsewhere classified  Abnormal posture     Problem List Patient Active Problem List   Diagnosis Date Noted  . Bursitis of right shoulder 12/29/2015   Starr Lake PT, DPT, LAT, ATC  02/23/2016  9:33 AM      [facility] [address] [place], [place], [address] Phone: [phone]   Fax:  [phone]  Name: Patricia Nielsen MRN: [id number] Date of Birth: 1946/05/13

## 2016-02-28 ENCOUNTER — Encounter: Payer: Medicare PPO | Admitting: Physical Therapy

## 2016-03-01 ENCOUNTER — Encounter: Payer: Medicare PPO | Admitting: Physical Therapy

## 2016-03-07 ENCOUNTER — Ambulatory Visit: Payer: Medicare PPO | Admitting: Physical Therapy

## 2016-03-07 DIAGNOSIS — R293 Abnormal posture: Secondary | ICD-10-CM | POA: Diagnosis not present

## 2016-03-07 DIAGNOSIS — M25611 Stiffness of right shoulder, not elsewhere classified: Secondary | ICD-10-CM

## 2016-03-07 DIAGNOSIS — M25511 Pain in right shoulder: Secondary | ICD-10-CM

## 2016-03-07 NOTE — Therapy (Addendum)
[place], [place], [address] Phone: [phone]   Fax:  [phone]  Physical Therapy Treatment / Discharge Note  Patient Details  Name: Patricia Nielsen MRN: [id number] Date of Birth: 03-Apr-1946 Referring Provider: Hulan Saas MD,  Encounter Date: 03/07/2016      PT End of Session - 03/07/16 0902    Visit Number 5   Number of Visits 13   Date for PT Re-Evaluation 03/22/16   Authorization Type Medicare: Kx mod by 15th visit, Progress note by 10th visit   PT Start Time 2876  shortened visit due to discharge   PT Stop Time 0920   PT Time Calculation (min) 33 min   Activity Tolerance Patient tolerated treatment well   Behavior During Therapy [facility] for tasks assessed/performed      Past Medical History  Diagnosis Date  . Hypertension     No past surgical history on file.  There were no vitals filed for this visit.      Subjective Assessment - 03/07/16 0849    Subjective " I have been feeling pretty good"    Currently in Pain? No/denies            [facility] PT Assessment - 03/07/16 8115    Observation/Other Assessments   Focus on Therapeutic Outcomes (FOTO)  19% limited   AROM   Right Shoulder Internal Rotation 75 Degrees  pain free   Right Shoulder External Rotation 86 Degrees  pain free   Strength   Right Shoulder Flexion 4+/5   Right Shoulder ABduction 4+/5  no pain just reported some discomfort   Right Shoulder Internal Rotation 4+/5   Right Shoulder External Rotation 4+/5   Left Shoulder Flexion 4+/5   Left Shoulder ABduction 4+/5   Left Shoulder Internal Rotation 4+/5   Left Shoulder External Rotation 4+/5                     [facility] Adult PT Treatment/Exercise - 03/07/16 0853    Shoulder Exercises: Seated   Extension AROM;Strengthening;Right;10 reps   Theraband Level (Shoulder Extension) Level 3 (Green)   Row AROM;Strengthening;Right;10 reps   Theraband Level  (Shoulder Row) Level 3 (Green)   Shoulder Exercises: ROM/Strengthening   UBE (Upper Arm Bike) L1.5 x 8 min  changing direction at 4 min                PT Education - 03/07/16 0926    Education provided Yes   Education Details Reviewed HEP and provided upgraded theraband for exercise progression. discussed continuing with HEP progressing with Reps/ sets to continue with strength and endurance as well as theraband resistance.    Person(s) Educated Patient   Methods Explanation;Demonstration;Verbal cues   Comprehension Verbalized understanding;Verbal cues required;Returned demonstration          PT Short Term Goals - 02/23/16 0932    PT SHORT TERM GOAL #1   Title pt will be I with inital HEP (03/01/2016)   Time 3   Period Weeks   Status Achieved   PT SHORT TERM GOAL #2   Title pt will be able to demosntrate proper posture/ lifting and carrying mechanics to decrease pain and prevent R shoulder reinjury (03/01/2016)   Time 3   Period Weeks   Status Achieved           PT Long Term Goals - 03/07/16 0912    PT LONG TERM GOAL #1   Title pt will be  I with all HEP as of last visit (03/22/2016)   Time 6   Period Weeks   Status Achieved   PT LONG TERM GOAL #2   Title pt will improve R shoulder internal rotation by >/= 10 degrees with </=2/10 pain to assist with dressing and ADLS (5/39/1225)   Time 6   Period Weeks   Status Achieved   PT LONG TERM GOAL #3   Title pt will improve her R shoulder strength to >/=4+/5 with </= 1/10 pain to assist with lifting and carrying activities with gardening (03/22/2016)   Time 6   Period Weeks   PT LONG TERM GOAL #4   Title pt will improve her FOTO score by >/= 5 points to demonstrate improvement in function at discharge (03/22/2016)   Baseline 81 which is the same at intake   Time 4   Period Weeks   Status Not Met       g-code: Carrying, moving and handling objects Foto/clinical judgement goal status: CI Discharge Status:  CI         Plan - 03/07/16 0924    Clinical Impression Statement Mrs. Kaylor has made great progress with PT improving shoulder AROM and strength and additionally reports being pain free. She was able to perform exercises given today with no report of pain, and states she is able to control tightness/ pain with exercises and stretching. pt reports she is able to maintain and progress her current level of function independently and will  be discharged from PT today.  She met all goals except for LTG # 4   PT Next Visit Plan Discharged from PT   PT Home Exercise Plan HEP review and given new therband    Consulted and Agree with Plan of Care Patient      Patient will benefit from skilled therapeutic intervention in order to improve the following deficits and impairments:  Increased muscle spasms, Impaired UE functional use, Improper body mechanics, Postural dysfunction, Decreased strength, Increased fascial restricitons, Decreased range of motion, Decreased activity tolerance, Decreased endurance  Visit Diagnosis: Pain in right shoulder  Stiffness of right shoulder, not elsewhere classified  Abnormal posture     Problem List Patient Active Problem List   Diagnosis Date Noted  . Bursitis of right shoulder 12/29/2015   Starr Lake PT, DPT, LAT, ATC  03/07/2016  9:30 AM      [facility] [address] [place], [place], [address] Phone: [phone]   Fax:  [phone]  Name: Patricia Nielsen MRN: [id number] Date of Birth: 27-Dec-1945   PHYSICAL THERAPY DISCHARGE SUMMARY  Visits from Start of Care: 5  Current functional level related to goals / functional outcomes: See goals   Remaining deficits: Intermittent stiffness in the R shoulder with abduction but she reported be able to control it with stretching and exercise.    Education / Equipment: HEP, posture education, theraband  Plan: Patient agrees to discharge.   Patient goals were partially met. Patient is being discharged due to being pleased with the current functional level.  ?????

## 2016-03-08 DIAGNOSIS — I10 Essential (primary) hypertension: Secondary | ICD-10-CM | POA: Diagnosis not present

## 2016-03-08 DIAGNOSIS — Z23 Encounter for immunization: Secondary | ICD-10-CM | POA: Diagnosis not present

## 2016-03-08 DIAGNOSIS — R7301 Impaired fasting glucose: Secondary | ICD-10-CM | POA: Diagnosis not present

## 2016-03-08 DIAGNOSIS — E782 Mixed hyperlipidemia: Secondary | ICD-10-CM | POA: Diagnosis not present

## 2016-03-09 ENCOUNTER — Encounter: Payer: Medicare PPO | Admitting: Physical Therapy

## 2016-03-13 ENCOUNTER — Encounter: Payer: Medicare PPO | Admitting: Physical Therapy

## 2016-03-15 ENCOUNTER — Encounter: Payer: Medicare PPO | Admitting: Physical Therapy

## 2016-06-15 ENCOUNTER — Other Ambulatory Visit: Payer: Self-pay | Admitting: Family Medicine

## 2016-06-15 DIAGNOSIS — Z1231 Encounter for screening mammogram for malignant neoplasm of breast: Secondary | ICD-10-CM

## 2016-06-23 DIAGNOSIS — B029 Zoster without complications: Secondary | ICD-10-CM | POA: Diagnosis not present

## 2016-07-04 ENCOUNTER — Ambulatory Visit: Payer: Medicare PPO

## 2016-08-18 ENCOUNTER — Encounter (HOSPITAL_COMMUNITY): Payer: Self-pay | Admitting: Emergency Medicine

## 2016-08-18 ENCOUNTER — Ambulatory Visit (HOSPITAL_COMMUNITY)
Admission: EM | Admit: 2016-08-18 | Discharge: 2016-08-18 | Disposition: A | Discharge status: Home / Self Care | Payer: Medicare PPO | Attending: Emergency Medicine | Admitting: Emergency Medicine

## 2016-08-18 DIAGNOSIS — S63501A Unspecified sprain of right wrist, initial encounter: Secondary | ICD-10-CM | POA: Diagnosis not present

## 2016-08-18 DIAGNOSIS — S76011A Strain of muscle, fascia and tendon of right hip, initial encounter: Secondary | ICD-10-CM

## 2016-08-18 DIAGNOSIS — W19XXXA Unspecified fall, initial encounter: Secondary | ICD-10-CM

## 2016-08-18 MED ORDER — KETOROLAC TROMETHAMINE 30 MG/ML IJ SOLN
INTRAMUSCULAR | Status: AC
  Filled 2016-08-18: qty 1

## 2016-08-18 MED ORDER — NAPROXEN 250 MG PO TABS: 250.0000 mg | ORAL_TABLET | Freq: Two times a day (BID) | ORAL | 0 refills | Status: DC

## 2016-08-18 MED ORDER — KETOROLAC TROMETHAMINE 30 MG/ML IJ SOLN
30.0000 mg | Freq: Once | INTRAMUSCULAR | Status: AC
  Administered 2016-08-18: 30 mg via INTRAMUSCULAR

## 2016-08-18 NOTE — Discharge Instructions (Signed)
Apply heat to your sore thigh muscle . Limit activity such as walking for a few days, then as tolerated. The wrist appears to to be well intact and with good function. May use as tolerated.

## 2016-08-18 NOTE — ED Provider Notes (Signed)
CSN: [id number]     Arrival date & time 08/18/16  1736 History   First MD Initiated Contact with Patient 08/18/16 1835     Chief Complaint  Patient presents with  . Fall   (Consider location/radiation/quality/duration/timing/severity/associated sxs/prior Treatment) 70 year old Asian female speaks English fluently states that she was in a grocery store this afternoon slipped and fell. She is uncertain as to the mechanism as it happened too fast. She is complaining of soreness to the right wrist and pain to the right medial thigh when standing and ambulating. Denies other injury. Denies injury to the head, neck, back, torso or other extremities. She remains fully awake and alert remembering the events of the accident and of today.      Past Medical History:  Diagnosis Date  . Hypertension    History reviewed. No pertinent surgical history. No family history on file. Social History  Substance Use Topics  . Smoking status: Never Smoker  . Smokeless tobacco: Never Used  . Alcohol use Not on file   OB History    No data available     Review of Systems  Constitutional: Negative for activity change, chills and fever.  HENT: Negative.   Respiratory: Negative.   Cardiovascular: Negative.   Musculoskeletal:       As per HPI  Skin: Negative for color change, pallor and rash.  Neurological: Negative.  Negative for dizziness, tremors, seizures, syncope, facial asymmetry, speech difficulty, weakness, light-headedness, numbness and headaches.  Psychiatric/Behavioral: Negative.   All other systems reviewed and are negative.   Allergies  Patient has no known allergies.  Home Medications   Prior to Admission medications   Medication Sig Start Date End Date Taking? Authorizing Provider  bisoprolol-hydrochlorothiazide [facility](ZIAC) 5-6.25 MG tablet 1 tablet Once a day by mouth 90 days 11/13/15  Yes Historical Provider, MD  rosuvastatin (CRESTOR) 20 MG tablet 1 tablet Once a day by mouth 30  day(s) 11/30/15  Yes Historical Provider, MD  naproxen (NAPROSYN) 250 MG tablet Take 1 tablet (250 mg total) by mouth 2 (two) times daily with a meal. Prn thigh pain 08/18/16   Hayden Rasmussenavid Peg Fifer, NP   Meds Ordered and Administered this Visit   Medications  ketorolac (TORADOL) 30 MG/ML injection 30 mg (not administered)    BP 145/87 (BP Location: Left Arm)   Pulse 68   Temp 97.9 F (36.6 C) (Oral)   Resp 20   SpO2 96%  No data found.   Physical Exam  Constitutional: She is oriented to person, place, and time. She appears well-developed and well-nourished. No distress.  HENT:  Head: Normocephalic and atraumatic.  Right Ear: External ear normal.  Left Ear: External ear normal.  Eyes: EOM are normal. Pupils are equal, round, and reactive to light.  Neck: Normal range of motion. Neck supple.  Cardiovascular: Normal rate.   Pulmonary/Chest: Effort normal. No respiratory distress.  Abdominal: Soft. There is no tenderness.  Musculoskeletal: She exhibits tenderness. She exhibits no edema or deformity.  Right wrist without swelling, deformity or discoloration. Full range of motion against resistance. No areas of tenderness. No discoloration. Able to make a tight fist. Extension, flexion, ulnar and radial deviation all intact. Distal neurovascular motor sensory grossly intact.  The only other area pain is to the right medial proximal thigh. On sitting she states she has no pain. Upon arising and standing she points to the proximal medial thigh as a source of pain. She was able to get onto the exam bed with minimal  assistance. No bony tenderness. Negative pelvic rock, no tenderness or pain to the knee or calf. No bony tenderness no percussion tenderness. Patient is able to perform straight leg raise but with some pain to the medial thigh. Palpation of the adductor muscle localizes and produces the pain for which she presents. No pain or tenderness to the elbow, pelvis, knee or abdomen.  Neurological:  She is alert and oriented to person, place, and time. She exhibits normal muscle tone.  Skin: Skin is warm and dry.  Psychiatric: She has a normal mood and affect.  Nursing note and vitals reviewed.   Urgent Care Course   Clinical Course     Procedures (including critical care time)  Labs Review Labs Reviewed - No data to display  Imaging Review No results found.   Visual Acuity Review  Right Eye Distance:   Left Eye Distance:   Bilateral Distance:    Right Eye Near:   Left Eye Near:    Bilateral Near:         MDM   1. Fall, initial encounter   2. Wrist sprain, right, initial encounter   3. Strain of right hip adductor muscle, initial encounter    Apply heat to your sore thigh muscle . Limit activity such as walking for a few days, then as tolerated. The wrist appears to to be well intact and with good function. May use as tolerated. Meds ordered this encounter  Medications  . ketorolac (TORADOL) 30 MG/ML injection 30 mg  . naproxen (NAPROSYN) 250 MG tablet    Sig: Take 1 tablet (250 mg total) by mouth 2 (two) times daily with a meal. Prn thigh pain    Dispense:  12 tablet    Refill:  0    Order Specific Question:   Supervising Provider    Answer:   Micheline ChapmanHONIG, ERIN J [[id number]]       Hayden Rasmussenavid Yanett Conkright, NP 08/18/16 1900

## 2016-08-18 NOTE — ED Triage Notes (Signed)
Pt c/o RLE and right wrist pain onset 1345  Reports she fell today at a supermarket while bending over to get milk  Denies head inj/LOC  Pain increases w/activity  Brought back on wheelchair  A&O x4... NAD

## 2016-09-10 NOTE — Progress Notes (Signed)
Tawana ScaleZach Smith D.O. [facility] [address] [place], [place] [address] Phone: [phone] Subjective:    CC: fell now right leg pain   ZHY:QMVHQIONGEHPI:Subjective  Patricia Nielsen is a 70 y.o. female coming in with complaint of Right leg pain. Patient was initially seen November 10 in the emergency department. Patient was in a grocery store and slipped and fell. Soreness of the right wrist initially but then also right medial thigh pain. Seems to be worse with standing and ambulate in. Now his been approximately 1 month. Patient was given some anti-inflammatories. Patient states Since then she continues to have pain. Only hurts her with angulation. Patient states that it seems to be in the groin area and radiates down the thigh somewhat. Patient states that she stands in one position she does not have any significant pain. No pain with sleeping. No pain with urination. Minimal back pain associated with it. Rates the severity of pain though has 8 out of 10 with walking. Ambulating with a cane.     Past Medical History:  Diagnosis Date  . Hypertension    No past surgical history on file. Social History   Social History  . Marital status: Married    Spouse name: N/A  . Number of children: N/A  . Years of education: N/A   Social History Main Topics  . Smoking status: Never Smoker  . Smokeless tobacco: Never Used  . Alcohol use None  . Drug use: Unknown  . Sexual activity: Not Asked   Other Topics Concern  . None   Social History Narrative  . None   No Known Allergies No family history on file.  Past medical history, social, surgical and family history all reviewed in electronic medical record.  No pertanent information unless stated regarding to the chief complaint.   Review of Systems:Review of systems updated and as accurate as of 09/11/16  No headache, visual changes, nausea, vomiting, diarrhea, constipation, dizziness, abdominal pain, skin rash, fevers, chills, night sweats,  weight loss, swollen lymph nodes, body aches, joint swelling, muscle aches, chest pain, shortness of breath, mood changes.   Objective  Blood pressure 134/84, pulse 73, height 5' (1.524 m), weight 119 lb (54 kg), SpO2 98 %. Systems examined below as of 09/11/16   General: No apparent distress alert and oriented x3 mood and affect normal, dressed appropriately.  HEENT: Pupils equal, extraocular movements intact  Respiratory: Patient's speak in full sentences and does not appear short of breath  Cardiovascular: No lower extremity edema, non tender, no erythema  Skin: Warm dry intact with no signs of infection or rash on extremities or on axial skeleton.  Abdomen: Soft nontender  Neuro: Cranial nerves II through XII are intact, neurovascularly intact in all extremities with 2+ DTRs and 2+ pulses.  Lymph: No lymphadenopathy of posterior or anterior cervical chain or axillae bilaterally.  Gait antalgic with the aid of a cane MSK:  Non tender with full range of motion and good stability and symmetric strength and tone of shoulders, elbows, wrist,  knee and ankles bilaterally. Arthritic changes Hip: Right ROM IR: 25 Deg, ER: 45 Deg, Flexion: 120 Deg, Extension: 100 Deg, Abduction: 45 Deg, Adduction: 45 Deg Strength IR: 5/5, ER: 5/5, Flexion: 5/5, Extension: 5/5, Abduction: 5/5, Adduction: 3/5 with increasing discomfort and pain Pelvic alignment unremarkable to inspection and palpation. Standing hip rotation and gait without trendelenburg sign / unsteadiness. Greater trochanter without tenderness to palpation. No tenderness over piriformis and greater trochanter. Positive  Pearlean BrownieFaber been negative pain with internal rotation No SI joint tenderness and normal minimal SI movement.  MSK US performed of: Right hip This study was ordered, performed, and interpreted by Terrilee FilesZach Smith D.O.  Hip: Trochanteric bursa without swelling or effusion. Acetabular labrum visualized and without tears, displacement, or  effusion in joint. Femoral neck appears unremarkable without increased power doppler signal along Cortex. Patient though does have an area of significant scar tissue formation of the adductor muscle group. Does have some mild retraction of 2 cm. Does have scar tissue formation noted though. No significant avulsion noted.  IMPRESSION:  Adductor muscle tear  Procedure note 97110; 15 minutes spent for Therapeutic exercises as stated in above notes.  This included exercises focusing on stretching, strengthening, with significant focus on eccentric aspects. Hip strengthening exercises which included:  Pelvic tilt/bracing to help with proper recruitment of the lower abs and pelvic floor muscles  Glute strengthening to properly contract glutes without over-engaging low back and hamstrings - prone hip extension and glute bridge exercises Proper stretching techniques to increase effectiveness for the hip flexors, groin, quads, piriformic and low back when appropriate     Proper technique shown and discussed handout in great detail with ATC.  All questions were discussed and answered.     Impression and Recommendations:     This case required medical decision making of moderate complexity.      Note: This dictation was prepared with Dragon dictation along with smaller phrase technology. Any transcriptional errors that result from this process are unintentional.

## 2016-09-11 ENCOUNTER — Ambulatory Visit: Payer: Self-pay

## 2016-09-11 ENCOUNTER — Ambulatory Visit (INDEPENDENT_AMBULATORY_CARE_PROVIDER_SITE_OTHER): Payer: Medicare PPO | Admitting: Family Medicine

## 2016-09-11 ENCOUNTER — Encounter: Payer: Self-pay | Admitting: Family Medicine

## 2016-09-11 ENCOUNTER — Other Ambulatory Visit: Payer: Self-pay | Admitting: Family Medicine

## 2016-09-11 ENCOUNTER — Ambulatory Visit (INDEPENDENT_AMBULATORY_CARE_PROVIDER_SITE_OTHER)
Admission: RE | Admit: 2016-09-11 | Discharge: 2016-09-11 | Disposition: A | Discharge status: Home / Self Care | Payer: Medicare PPO | Source: Ambulatory Visit | Attending: Family Medicine | Admitting: Family Medicine

## 2016-09-11 VITALS — BP 134/84 | HR 73 | Ht 60.0 in | Wt 119.0 lb

## 2016-09-11 DIAGNOSIS — S76201A Unspecified injury of adductor muscle, fascia and tendon of right thigh, initial encounter: Secondary | ICD-10-CM | POA: Diagnosis not present

## 2016-09-11 DIAGNOSIS — M79604 Pain in right leg: Secondary | ICD-10-CM

## 2016-09-11 DIAGNOSIS — S79911A Unspecified injury of right hip, initial encounter: Secondary | ICD-10-CM | POA: Diagnosis not present

## 2016-09-11 NOTE — Patient Instructions (Signed)
Good to see you  Ice 20 minutes 2 times daily. Usually after activity and before bed. Compression sleeve to the thigh with any activity do not wear at night Exercises 3 times a week.  Continue the cane  Xray downstairs pennsaid pinkie amount topically 2 times daily as needed.  See me again within 4 weeks.  Happy holidays!

## 2016-09-11 NOTE — Assessment & Plan Note (Addendum)
Patient does have an adductor tear. Patient is going to do compression, given home exercise by athletic trainer, we discussed icing regimen and home exercises. We discussed which activities to avoid. We discussed which signs and symptoms and when to seek medical attention. Patient x-rays are pending. This will be to rule out any avulsion fracture. Patient will come back and see me again in 4 weeks.

## 2016-09-24 ENCOUNTER — Emergency Department (HOSPITAL_COMMUNITY): Payer: Medicare PPO

## 2016-09-24 ENCOUNTER — Encounter (HOSPITAL_COMMUNITY): Payer: Self-pay | Admitting: Emergency Medicine

## 2016-09-24 ENCOUNTER — Observation Stay (HOSPITAL_COMMUNITY)
Admission: EM | Admit: 2016-09-24 | Discharge: 2016-09-25 | Disposition: A | Discharge status: Home / Self Care | Payer: Medicare PPO | Attending: Internal Medicine | Admitting: Internal Medicine

## 2016-09-24 DIAGNOSIS — Z823 Family history of stroke: Secondary | ICD-10-CM | POA: Diagnosis not present

## 2016-09-24 DIAGNOSIS — R001 Bradycardia, unspecified: Secondary | ICD-10-CM | POA: Diagnosis not present

## 2016-09-24 DIAGNOSIS — R410 Disorientation, unspecified: Secondary | ICD-10-CM | POA: Diagnosis not present

## 2016-09-24 DIAGNOSIS — Z8619 Personal history of other infectious and parasitic diseases: Secondary | ICD-10-CM | POA: Insufficient documentation

## 2016-09-24 DIAGNOSIS — E876 Hypokalemia: Secondary | ICD-10-CM | POA: Diagnosis not present

## 2016-09-24 DIAGNOSIS — Z8249 Family history of ischemic heart disease and other diseases of the circulatory system: Secondary | ICD-10-CM | POA: Diagnosis not present

## 2016-09-24 DIAGNOSIS — G9340 Encephalopathy, unspecified: Secondary | ICD-10-CM

## 2016-09-24 DIAGNOSIS — Z9181 History of falling: Secondary | ICD-10-CM | POA: Diagnosis not present

## 2016-09-24 DIAGNOSIS — R413 Other amnesia: Secondary | ICD-10-CM | POA: Diagnosis not present

## 2016-09-24 DIAGNOSIS — I10 Essential (primary) hypertension: Secondary | ICD-10-CM | POA: Diagnosis not present

## 2016-09-24 DIAGNOSIS — E785 Hyperlipidemia, unspecified: Secondary | ICD-10-CM | POA: Diagnosis present

## 2016-09-24 DIAGNOSIS — G454 Transient global amnesia: Secondary | ICD-10-CM | POA: Diagnosis not present

## 2016-09-24 DIAGNOSIS — Z833 Family history of diabetes mellitus: Secondary | ICD-10-CM | POA: Diagnosis not present

## 2016-09-24 DIAGNOSIS — R4182 Altered mental status, unspecified: Secondary | ICD-10-CM | POA: Diagnosis not present

## 2016-09-24 LAB — COMPREHENSIVE METABOLIC PANEL
ALK PHOS: 104 U/L (ref 38–126)
ALT: 27 U/L (ref 14–54)
ANION GAP: 8 (ref 5–15)
AST: 27 U/L (ref 15–41)
Albumin: 4.3 g/dL (ref 3.5–5.0)
BILIRUBIN TOTAL: 0.8 mg/dL (ref 0.3–1.2)
BUN: 14 mg/dL (ref 6–20)
CALCIUM: 10.5 mg/dL — AB (ref 8.9–10.3)
CO2: 27 mmol/L (ref 22–32)
Chloride: 105 mmol/L (ref 101–111)
Creatinine, Ser: 0.46 mg/dL (ref 0.44–1.00)
GFR calc non Af Amer: >60 mL/min (ref >60)
Glucose, Bld: 165 mg/dL — ABNORMAL HIGH (ref 65–99)
Potassium: 4 mmol/L (ref 3.5–5.1)
SODIUM: 140 mmol/L (ref 135–145)
TOTAL PROTEIN: 7.4 g/dL (ref 6.5–8.1)

## 2016-09-24 LAB — URINALYSIS, ROUTINE W REFLEX MICROSCOPIC
Bilirubin Urine: NEGATIVE
Glucose, UA: NEGATIVE mg/dL
Hgb urine dipstick: NEGATIVE
Ketones, ur: NEGATIVE mg/dL
Leukocytes, UA: NEGATIVE
Nitrite: NEGATIVE
Protein, ur: NEGATIVE mg/dL
Specific Gravity, Urine: 1.01 (ref 1.005–1.030)
pH: 7 (ref 5.0–8.0)

## 2016-09-24 LAB — CBG MONITORING, ED: GLUCOSE-CAPILLARY: 157 mg/dL — AB (ref 65–99)

## 2016-09-24 LAB — CBC
HCT: 44.5 % (ref 36.0–46.0)
HEMOGLOBIN: 14.6 g/dL (ref 12.0–15.0)
MCH: 31.1 pg (ref 26.0–34.0)
MCHC: 32.8 g/dL (ref 30.0–36.0)
MCV: 94.9 fL (ref 78.0–100.0)
PLATELETS: 243 10*3/uL (ref 150–400)
RBC: 4.69 MIL/uL (ref 3.87–5.11)
RDW: 12.3 % (ref 11.5–15.5)
WBC: 11.2 10*3/uL — AB (ref 4.0–10.5)

## 2016-09-24 MED ORDER — ASPIRIN 325 MG PO TABS
325.0000 mg | ORAL_TABLET | Freq: Every day | ORAL | Status: DC
  Administered 2016-09-24 – 2016-09-25 (×2): 325 mg via ORAL
  Filled 2016-09-24 (×2): qty 1

## 2016-09-24 MED ORDER — ACETAMINOPHEN 325 MG PO TABS: 650.0000 mg | ORAL_TABLET | ORAL | Status: DC | PRN

## 2016-09-24 MED ORDER — ACETAMINOPHEN 160 MG/5ML PO SOLN: 650.0000 mg | ORAL | Status: DC | PRN

## 2016-09-24 MED ORDER — ASPIRIN 300 MG RE SUPP: 300.0000 mg | Freq: Every day | RECTAL | Status: DC

## 2016-09-24 MED ORDER — SODIUM CHLORIDE 0.9 % IV SOLN
INTRAVENOUS | Status: DC
  Administered 2016-09-24: 22:00:00 via INTRAVENOUS

## 2016-09-24 MED ORDER — ENOXAPARIN SODIUM 40 MG/0.4ML ~~LOC~~ SOLN: 40.0000 mg | SUBCUTANEOUS | Status: DC

## 2016-09-24 MED ORDER — ACETAMINOPHEN 650 MG RE SUPP: 650.0000 mg | RECTAL | Status: DC | PRN

## 2016-09-24 MED ORDER — ROSUVASTATIN CALCIUM 20 MG PO TABS
20.0000 mg | ORAL_TABLET | Freq: Every day | ORAL | Status: DC
  Administered 2016-09-24: 20 mg via ORAL
  Filled 2016-09-24: qty 1

## 2016-09-24 MED ORDER — BISOPROLOL FUMARATE 5 MG PO TABS
5.0000 mg | ORAL_TABLET | Freq: Every day | ORAL | Status: DC
  Administered 2016-09-25: 5 mg via ORAL
  Filled 2016-09-24: qty 1

## 2016-09-24 NOTE — H&P (Signed)
History and Physical    Lianne BushyHisae K Mehring QQV:[id number] DOB: 08/25/1946 DOA: 09/24/2016  PCP: Allean FoundSMITH,CANDACE THIELE, MD  Patient coming from: Home.  Chief Complaint: Transient confusion.  HPI: Patricia Nielsen is a 70 y.o. female with history of hypertension and hyperlipidemia was brought to the ER after patient's husband found that patient was having transient confusion and does not recall the incident. At around 12 noon today patient was with husband in the car as a passenger. Patient was repeating the same words and did not recall certain objects in the car. Patient was brought to the ER and had CT head which was unremarkable. Patient's symptoms lasted for around 45 minutes and resolved. Patient does not recall the incident. 6 weeks ago patient had a fall in a shopping center and patient at that time also does not recall how she fell. As per the husband patient did not have any loss of consciousness today. In the ER patient appeared nonfocal. On-call neurologist has been consulted and patient is being admitted for further management of possible transient global amnesia.   ED Course: CT head was unremarkable neurology consulted. Labs show mild hypercalcemia.  Review of Systems: As per HPI, rest all negative.   Past Medical History:  Diagnosis Date  . Hypertension     History reviewed. No pertinent surgical history.   reports that she has never smoked. She has never used smokeless tobacco. Her alcohol and drug histories are not on file.  No Known Allergies  Family History  Problem Relation Age of Onset  . Diabetes Mellitus II Neg Hx   . CAD Neg Hx   . Stroke Neg Hx     Prior to Admission medications   Medication Sig Start Date End Date Taking? Authorizing Provider  bisoprolol-hydrochlorothiazide (ZIAC) 5-6.25 MG tablet 1 tablet by mouth daily. 11/13/15  Yes Historical Provider, MD  cholecalciferol (VITAMIN D) 1000 units tablet Take 2,000 Units by mouth daily.   Yes Historical  Provider, MD  rosuvastatin (CRESTOR) 20 MG tablet 1 tablet Once a day by mouth 30 day(s) 11/30/15  Yes Historical Provider, MD  TURMERIC PO Take 1 tablet by mouth daily.   Yes Historical Provider, MD  vitamin C (ASCORBIC ACID) 500 MG tablet Take 500 mg by mouth daily.   Yes Historical Provider, MD    Physical Exam: Vitals:   09/24/16 1930 09/24/16 1930 09/24/16 1945 09/24/16 2000  BP: 136/70  140/76 157/82  Pulse: 64  62 64  Resp:      Temp:  98.7 F (37.1 C)    SpO2: 96%  98% 97%  Weight:      Height:          Constitutional: Moderately built and nourished. Vitals:   09/24/16 1930 09/24/16 1930 09/24/16 1945 09/24/16 2000  BP: 136/70  140/76 157/82  Pulse: 64  62 64  Resp:      Temp:  98.7 F (37.1 C)    SpO2: 96%  98% 97%  Weight:      Height:       Eyes: Anicteric no pallor. ENMT: No discharge from the ears eyes nose or mouth. Neck: No neck rigidity no mass felt. Respiratory: No rhonchi or crepitations. Cardiovascular: S1-S2 heard no murmurs appreciated. Abdomen: Soft nontender bowel sounds present. No guarding or rigidity. Musculoskeletal: No edema. No joint effusion. Skin: No rash. Skin appears warm. Neurologic: Alert awake oriented to time place and person. Moves all extremities 5 x 5. No facial asymmetry. Tongue is  midline. Pupils equal and reacting. Psychiatric: Appears normal. Normal affect.   Labs on Admission: I have personally reviewed following labs and imaging studies  CBC:  Recent Labs Lab 09/24/16 1356  WBC 11.2*  HGB 14.6  HCT 44.5  MCV 94.9  PLT 243   Basic Metabolic Panel:  Recent Labs Lab 09/24/16 1356  NA 140  K 4.0  CL 105  CO2 27  GLUCOSE 165*  BUN 14  CREATININE 0.46  CALCIUM 10.5*   GFR: Estimated Creatinine Clearance: 45.7 mL/min (by C-G formula based on SCr of 0.46 mg/dL). Liver Function Tests:  Recent Labs Lab 09/24/16 1356  AST 27  ALT 27  ALKPHOS 104  BILITOT 0.8  PROT 7.4  ALBUMIN 4.3   No results for  input(s): LIPASE, AMYLASE in the last 168 hours. No results for input(s): AMMONIA in the last 168 hours. Coagulation Profile: No results for input(s): INR, PROTIME in the last 168 hours. Cardiac Enzymes: No results for input(s): CKTOTAL, CKMB, CKMBINDEX, TROPONINI in the last 168 hours. BNP (last 3 results) No results for input(s): PROBNP in the last 8760 hours. HbA1C: No results for input(s): HGBA1C in the last 72 hours. CBG:  Recent Labs Lab 09/24/16 1355  GLUCAP 157*   Lipid Profile: No results for input(s): CHOL, HDL, LDLCALC, TRIG, CHOLHDL, LDLDIRECT in the last 72 hours. Thyroid Function Tests: No results for input(s): TSH, T4TOTAL, FREET4, T3FREE, THYROIDAB in the last 72 hours. Anemia Panel: No results for input(s): VITAMINB12, FOLATE, FERRITIN, TIBC, IRON, RETICCTPCT in the last 72 hours. Urine analysis:    Component Value Date/Time   COLORURINE YELLOW 09/24/2016 1850   APPEARANCEUR CLEAR 09/24/2016 1850   LABSPEC 1.010 09/24/2016 1850   PHURINE 7.0 09/24/2016 1850   GLUCOSEU NEGATIVE 09/24/2016 1850   HGBUR NEGATIVE 09/24/2016 1850   BILIRUBINUR NEGATIVE 09/24/2016 1850   KETONESUR NEGATIVE 09/24/2016 1850   PROTEINUR NEGATIVE 09/24/2016 1850   NITRITE NEGATIVE 09/24/2016 1850   LEUKOCYTESUR NEGATIVE 09/24/2016 1850   Sepsis Labs: @LABRCNTIP (procalcitonin:4,lacticidven:4) )No results found for this or any previous visit (from the past 240 hour(s)).   Radiological Exams on Admission: Dg Chest 2 View  Result Date: 09/24/2016 CLINICAL DATA:  Altered mental status EXAM: CHEST  2 VIEW COMPARISON:  None. FINDINGS: The heart size and mediastinal contours are within normal limits. Both lungs are clear. The visualized skeletal structures are unremarkable. IMPRESSION: No active cardiopulmonary disease. Electronically Signed   By: Gerome Sam III M.D   On: 09/24/2016 18:38   Ct Head Wo Contrast  Result Date: 09/24/2016 CLINICAL DATA:  Short-term memory loss EXAM:  CT HEAD WITHOUT CONTRAST TECHNIQUE: Contiguous axial images were obtained from the base of the skull through the vertex without intravenous contrast. COMPARISON:  09/26/2006 FINDINGS: Brain: No evidence of acute infarction, hemorrhage, hydrocephalus, extra-axial collection or mass lesion/mass effect. Vascular: No hyperdense vessel or unexpected calcification. Skull: Normal. Negative for fracture or focal lesion. Sinuses/Orbits: No acute finding. Other: None. IMPRESSION: No acute intracranial abnormality noted. Electronically Signed   By: Alcide Clever M.D.   On: 09/24/2016 17:35    EKG: Independently reviewed. Sinus bradycardia with fusion complexes.  Assessment/Plan Principal Problem:   Transient global amnesia Active Problems:   Hypercalcemia   Essential hypertension   HLD (hyperlipidemia)   Amnesia    1. Transient global amnesia - discussed with on-call neurologist. At this time plan is to get MRI brain and EEG. Further recommendation based on these tests. Patient appears to be back to her baseline.  2. Mild hypercalcemia - will hold hydrochlorothiazide and gently hydrate. Hold vitamin D supplements. Further workup as outpatient. 3. Hypertension - patient is on bisoprolol. Patient is mildly bradycardic. I have placed holding parameters for bisoprolol. Hold hydrochlorothiazide due to hypercalcemia. 4. Hyperlipidemia on statins.   DVT prophylaxis: Lovenox. Code Status: Full code.  Family Communication: Patient's husband.  Disposition Plan: Home.  Consults called: Neurology.  Admission status: Observation.    Eduard ClosKAKRAKANDY,Damyah Gugel N. MD [facility] Pager [phone].  If 7PM-7AM, please contact night-coverage [email or web address] Password [email or web address]  09/24/2016, 8:57 PM

## 2016-09-24 NOTE — Consult Note (Signed)
NEURO HOSPITALIST CONSULT NOTE   Requestig physician: Dr. Toniann Fail  Reason for Consult: Transient loss of short term memory  History obtained from:  Patient and Chart     HPI:                                                                                                                                          Patricia Nielsen is an 70 y.o. female who presented for further evaluation of transient short-term memory loss. Per chart, husband related to ED staff that about noon on Sunday she suddenly started asking the same questions repeatedly, such as about a lamp they had purchased or why it was purchased. She knew who she was, who her husband was and was not agitated or aphasic. Her symptoms began to resolve after arrival to the ED, but were still present. The short term memory loss was unaccompanied by altered sensorium, focal motor deficit, hallucinations, sensory loss, dizziness, vision changes or headache. She was alert and oriented in the ED with NIHSS of 0. The patient has no memory of these events. The last thing she remembers is running errands with her husband. The first thing she remembers following the spell is being in her room on [address] [place].   Past Medical History:  Diagnosis Date  . Hypertension     History reviewed. No pertinent surgical history.  Family History  Problem Relation Age of Onset  . Diabetes Mellitus II Neg Hx   . CAD Neg Hx   . Stroke Neg Hx     Social History:  reports that she has never smoked. She has never used smokeless tobacco. Her alcohol and drug histories are not on file.  No Known Allergies  MEDICATIONS:                                                                                                                     No current facility-administered medications on file prior to encounter.    Current Outpatient Prescriptions on File Prior to Encounter  Medication Sig Dispense Refill  . bisoprolol-hydrochlorothiazide (ZIAC)  5-6.25 MG tablet 1 tablet by mouth daily.  1  . rosuvastatin (CRESTOR) 20 MG tablet 1 tablet Once a day by mouth 30 day(s)  0    ROS:  History obtained from chart due to patient's memory loss for the event. She has no current complaints at time of bedside evaluation.   Blood pressure 157/82, pulse 64, temperature 98.7 F (37.1 C), resp. rate 18, height 5' (1.524 m), weight 53.1 kg (117 lb), SpO2 97 %.   General Examination:                                                                                                      HEENT-  Normocephalic/atraumatic.  Lungs- Respirations unlabored. No gross wheezing.  Extremities- Warm and well-perfused.   Neurological Examination Mental Status: Alert, fully oriented, thought content appropriate.  Speech fluent without evidence of aphasia.  Able to follow all commands without difficulty. Pleasant and cooperative. Long term memory intact. Unable to recall events on Sunday from approximately noon until late in the evening. Cranial Nerves: II:  Visual fields intact to bedside confrontation, PERRL III,IV, VI: ptosis not present, EOMI V,VII: smile symmetric, facial temperature sensation normal bilaterally VIII: hearing intact to conversation IX,X: no hypophonia or hoarseness XI: symmetric XII: midline tongue extension Motor: Right : Upper extremity   5/5    Left:     Upper extremity   5/5  Lower extremity   5/5     Lower extremity   5/5 Normal tone throughout; no atrophy noted Sensory: Temperature and light touch intact x 4, without extinction Deep Tendon Reflexes: 2+ and symmetric throughout Cerebellar:  Normal FNF bilaterally Gait: Deferred   Lab Results: Basic Metabolic Panel:  Recent Labs Lab 09/24/16 1356  NA 140  K 4.0  CL 105  CO2 27  GLUCOSE 165*  BUN 14  CREATININE 0.46  CALCIUM 10.5*     Liver Function Tests:  Recent Labs Lab 09/24/16 1356  AST 27  ALT 27  ALKPHOS 104  BILITOT 0.8  PROT 7.4  ALBUMIN 4.3   No results for input(s): LIPASE, AMYLASE in the last 168 hours. No results for input(s): AMMONIA in the last 168 hours.  CBC:  Recent Labs Lab 09/24/16 1356  WBC 11.2*  HGB 14.6  HCT 44.5  MCV 94.9  PLT 243    Cardiac Enzymes: No results for input(s): CKTOTAL, CKMB, CKMBINDEX, TROPONINI in the last 168 hours.  Lipid Panel: No results for input(s): CHOL, TRIG, HDL, CHOLHDL, VLDL, LDLCALC in the last 168 hours.  CBG:  Recent Labs Lab 09/24/16 1355  GLUCAP 157*    Microbiology: No results found for this or any previous visit.  Coagulation Studies: No results for input(s): LABPROT, INR in the last 72 hours.  Imaging: Dg Chest 2 View  Result Date: 09/24/2016 CLINICAL DATA:  Altered mental status EXAM: CHEST  2 VIEW COMPARISON:  None. FINDINGS: The heart size and mediastinal contours are within normal limits. Both lungs are clear. The visualized skeletal structures are unremarkable. IMPRESSION: No active cardiopulmonary disease. Electronically Signed   By: Gerome Samavid  Williams III M.D   On: 09/24/2016 18:38   Ct Head Wo Contrast  Result Date: 09/24/2016 CLINICAL DATA:  Short-term memory loss EXAM: CT HEAD WITHOUT CONTRAST TECHNIQUE: Contiguous  axial images were obtained from the base of the skull through the vertex without intravenous contrast. COMPARISON:  09/26/2006 FINDINGS: Brain: No evidence of acute infarction, hemorrhage, hydrocephalus, extra-axial collection or mass lesion/mass effect. Vascular: No hyperdense vessel or unexpected calcification. Skull: Normal. Negative for fracture or focal lesion. Sinuses/Orbits: No acute finding. Other: None. IMPRESSION: No acute intracranial abnormality noted. Electronically Signed   By: Alcide CleverMark  Lukens M.D.   On: 09/24/2016 17:35    Assessment: 1. Transient anterograde memory loss. No other deficits per  history or exam. CT head negative for acute abnormality. Overall clinical picture most consistent with Transient Global Amnesia. Also possible but significantly less likely would be atypical manifestation of seizure or stroke.  2. HTN.   Recommendations: 1. MRI brain.  2. EEG.   Electronically signed: Dr. Caryl PinaEric Inessa Wardrop 09/24/2016, 8:46 PM

## 2016-09-24 NOTE — ED Triage Notes (Signed)
Husband stated, she (wife) stated, She did not remember questions or could not answer the questions I ask her. Pt. Is alert and oriented, but unable to answer president and date. NIH scale was negative.

## 2016-09-24 NOTE — ED Notes (Signed)
Attempted report x1. 

## 2016-09-24 NOTE — ED Notes (Signed)
Patient transported to CT 

## 2016-09-24 NOTE — Progress Notes (Signed)
Dr. Clearence PedSchorr notified patient passed stroke swallow screen in Ed. Inquiring if want to order diet. Awaiting response.

## 2016-09-24 NOTE — ED Provider Notes (Signed)
[facility]-EMERGENCY DEPT Provider Note   CSN: [id number] Arrival date & time: 09/24/16  1324     History   Chief Complaint Chief Complaint  Patient presents with  . Altered Mental Status    HPI Patricia Nielsen is a 70 y.o. female.  HPI   70 year old female presents today with confusion. Husband is at bedside who reports that patient was feeling well this morning. They were running errands around town when abruptly at 12:00 she started to have confusion. He reports she kept asking questions about a lamp they had recently purchased. She did not remember  purchasing it or why they had it. He reports symptoms continue to persist until arrival in the emergency room. He reports that they seem to be improving, but she is still having some memory issues. Both patient and the husband deny any significant complaints this morning, report that she is eating and drinking appropriately, has no infectious etiology including upper respiratory, chest abdomen or pelvis. He notes a past medical history of shingles, and a fall that caused a muscle tear currently followed by orthopedics.  Past Medical History:  Diagnosis Date  . Hypertension     Patient Active Problem List   Diagnosis Date Noted  . Amnesia 09/24/2016  . Injury of adductor muscle and tendon of thigh, right, initial encounter 09/11/2016  . Bursitis of right shoulder 12/29/2015    History reviewed. No pertinent surgical history.  OB History    No data available       Home Medications    Prior to Admission medications   Medication Sig Start Date End Date Taking? Authorizing Provider  bisoprolol-hydrochlorothiazide (ZIAC) 5-6.25 MG tablet 1 tablet by mouth daily. 11/13/15  Yes Historical Provider, MD  cholecalciferol (VITAMIN D) 1000 units tablet Take 2,000 Units by mouth daily.   Yes Historical Provider, MD  rosuvastatin (CRESTOR) 20 MG tablet 1 tablet Once a day by mouth 30 day(s) 11/30/15  Yes Historical Provider, MD  TURMERIC PO  Take 1 tablet by mouth daily.   Yes Historical Provider, MD  vitamin C (ASCORBIC ACID) 500 MG tablet Take 500 mg by mouth daily.   Yes Historical Provider, MD    Family History Family History  Problem Relation Age of Onset  . Diabetes Mellitus II Neg Hx   . CAD Neg Hx   . Stroke Neg Hx     Social History Social History  Substance Use Topics  . Smoking status: Never Smoker  . Smokeless tobacco: Never Used  . Alcohol use Not on file     Allergies   Patient has no known allergies.   Review of Systems Review of Systems  All other systems reviewed and are negative.  Physical Exam Updated Vital Signs BP 140/76   Pulse 62   Temp 98.7 F (37.1 C)   Resp 18   Ht 5' (1.524 m)   Wt 53.1 kg   SpO2 98%   BMI 22.85 kg/m   Physical Exam  Constitutional: She is oriented to person, place, and time. She appears well-developed and well-nourished.  HENT:  Head: Normocephalic and atraumatic.  Eyes: Conjunctivae are normal. Pupils are equal, round, and reactive to light. Right eye exhibits no discharge. Left eye exhibits no discharge. No scleral icterus.  Neck: Normal range of motion. No JVD present. No tracheal deviation present.  Pulmonary/Chest: Effort normal. No stridor.  Neurological: She is alert and oriented to person, place, and time. She has normal strength. No cranial nerve deficit or sensory  deficit. Coordination normal. GCS eye subscore is 4. GCS verbal subscore is 5. GCS motor subscore is 6.  NIH 0- patient unable to provide day of the week, with thinking she is able to provide month of the year, and quickly able to provide year  Psychiatric: She has a normal mood and affect. Her behavior is normal. Judgment and thought content normal.  Nursing note and vitals reviewed.    ED Treatments / Results  Labs (all labs ordered are listed, but only abnormal results are displayed) Labs Reviewed  COMPREHENSIVE METABOLIC PANEL - Abnormal; Notable for the following:        Result Value   Glucose, Bld 165 (*)    Calcium 10.5 (*)    All other components within normal limits  CBC - Abnormal; Notable for the following:    WBC 11.2 (*)    All other components within normal limits  CBG MONITORING, ED - Abnormal; Notable for the following:    Glucose-Capillary 157 (*)    All other components within normal limits  URINALYSIS, ROUTINE W REFLEX MICROSCOPIC    EKG  EKG Interpretation  Date/Time:  Sunday September 24 2016 13:42:58 EST Ventricular Rate:  51 PR Interval:  186 QRS Duration: 90 QT Interval:  470 QTC Calculation: 433 R Axis:   -37 Text Interpretation:  Sinus bradycardia with Fusion complexes Left axis deviation Nonspecific T wave abnormality Abnormal ECG No old tracing to compare Confirmed by BELFI  MD, MELANIE ([id number]) on 09/24/2016 4:46:22 PM       Radiology Dg Chest 2 View  Result Date: 09/24/2016 CLINICAL DATA:  Altered mental status EXAM: CHEST  2 VIEW COMPARISON:  None. FINDINGS: The heart size and mediastinal contours are within normal limits. Both lungs are clear. The visualized skeletal structures are unremarkable. IMPRESSION: No active cardiopulmonary disease. Electronically Signed   By: Gerome Sam III M.D   On: 09/24/2016 18:38   Ct Head Wo Contrast  Result Date: 09/24/2016 CLINICAL DATA:  Short-term memory loss EXAM: CT HEAD WITHOUT CONTRAST TECHNIQUE: Contiguous axial images were obtained from the base of the skull through the vertex without intravenous contrast. COMPARISON:  09/26/2006 FINDINGS: Brain: No evidence of acute infarction, hemorrhage, hydrocephalus, extra-axial collection or mass lesion/mass effect. Vascular: No hyperdense vessel or unexpected calcification. Skull: Normal. Negative for fracture or focal lesion. Sinuses/Orbits: No acute finding. Other: None. IMPRESSION: No acute intracranial abnormality noted. Electronically Signed   By: Alcide Clever M.D.   On: 09/24/2016 17:35    Procedures Procedures (including  critical care time)  Medications Ordered in ED Medications - No data to display   Initial Impression / Assessment and Plan / ED Course  I have reviewed the triage vital signs and the nursing notes.  Pertinent labs & imaging results that were available during my care of the patient were reviewed by me and considered in my medical decision making (see chart for details).  Clinical Course     Labs:Urinalysis, CBC, CMP  Imaging:CT head without  Consults:  Therapeutics:  Discharge Meds:   Assessment/Plan:  70 year old female presents today with acute confusion. She has no other neurological deficits. She appears to be improving but is not at her baseline yet. Basic labs with CT head will be ordered neurology will consulted for potential stroke.   8:18 PM  I spoke with neurology who recommended labs CT stroke at this time, they will evaluate the patient for further guidance.  CT shows no significant findings, neurology to consult patient recommended  hospital observation for TIA workup.   Final Clinical Impressions(s) / ED Diagnoses   Final diagnoses:  Confusion    New Prescriptions New Prescriptions   No medications on file     Eyvonne MechanicJeffrey Deshawn Witty, PA-C 09/24/16 2019    Rolan BuccoMelanie Belfi, MD 09/25/16 2057

## 2016-09-24 NOTE — ED Notes (Signed)
Pt given dinner tray.

## 2016-09-25 ENCOUNTER — Observation Stay (HOSPITAL_COMMUNITY): Payer: Medicare PPO

## 2016-09-25 DIAGNOSIS — G454 Transient global amnesia: Secondary | ICD-10-CM | POA: Diagnosis not present

## 2016-09-25 DIAGNOSIS — I10 Essential (primary) hypertension: Secondary | ICD-10-CM

## 2016-09-25 DIAGNOSIS — G9340 Encephalopathy, unspecified: Secondary | ICD-10-CM | POA: Diagnosis not present

## 2016-09-25 DIAGNOSIS — E785 Hyperlipidemia, unspecified: Secondary | ICD-10-CM

## 2016-09-25 DIAGNOSIS — R413 Other amnesia: Secondary | ICD-10-CM | POA: Diagnosis not present

## 2016-09-25 LAB — CBC
HCT: 40.7 % (ref 36.0–46.0)
Hemoglobin: 13.2 g/dL (ref 12.0–15.0)
MCH: 30.8 pg (ref 26.0–34.0)
MCHC: 32.4 g/dL (ref 30.0–36.0)
MCV: 95.1 fL (ref 78.0–100.0)
PLATELETS: 221 10*3/uL (ref 150–400)
RBC: 4.28 MIL/uL (ref 3.87–5.11)
RDW: 12.7 % (ref 11.5–15.5)
WBC: 9 10*3/uL (ref 4.0–10.5)

## 2016-09-25 LAB — COMPREHENSIVE METABOLIC PANEL
ALK PHOS: 85 U/L (ref 38–126)
ALT: 22 U/L (ref 14–54)
AST: 20 U/L (ref 15–41)
Albumin: 3.5 g/dL (ref 3.5–5.0)
Anion gap: 8 (ref 5–15)
BILIRUBIN TOTAL: 0.7 mg/dL (ref 0.3–1.2)
BUN: 11 mg/dL (ref 6–20)
CALCIUM: 9.8 mg/dL (ref 8.9–10.3)
CHLORIDE: 105 mmol/L (ref 101–111)
CO2: 26 mmol/L (ref 22–32)
CREATININE: 0.6 mg/dL (ref 0.44–1.00)
GFR calc Af Amer: >60 mL/min (ref >60)
Glucose, Bld: 96 mg/dL (ref 65–99)
Potassium: 3.3 mmol/L — ABNORMAL LOW (ref 3.5–5.1)
Sodium: 139 mmol/L (ref 135–145)
Total Protein: 6.3 g/dL — ABNORMAL LOW (ref 6.5–8.1)

## 2016-09-25 MED ORDER — POTASSIUM CHLORIDE CRYS ER 20 MEQ PO TBCR
40.0000 meq | EXTENDED_RELEASE_TABLET | Freq: Once | ORAL | Status: AC
  Administered 2016-09-25: 40 meq via ORAL
  Filled 2016-09-25: qty 2

## 2016-09-25 NOTE — Progress Notes (Signed)
Patient to MRI via wheelchair with transport. Patient states will remove jewelry prior to test. Patient alert and oriented.

## 2016-09-25 NOTE — Discharge Summary (Signed)
Physician Discharge Summary  Patricia Nielsen AOZ:[id number] DOB: Feb 01, 1946 DOA: 09/24/2016  PCP: Patricia FoundSMITH,CANDACE THIELE, MD  Admit date: 09/24/2016 Discharge date: 09/25/2016   Recommendations for Outpatient Follow-Up:   1. Outpatient MRI in 6-12 months 2. Ambulatory ENT referral for sinus opacification 3. Watch Ca on HCTZ   Discharge Diagnosis:   Principal Problem:   Transient global amnesia Active Problems:   Hypercalcemia   Essential hypertension   HLD (hyperlipidemia)   Amnesia   Acute encephalopathy   Discharge disposition:  Home  Discharge Condition: Improved.  Diet recommendation: Low sodium, heart healthy. Wound care: None.   History of Present Illness:   [name] is a 70 y.o. female with history of hypertension and hyperlipidemia was brought to the ER after patient's husband found that patient was having transient confusion and does not recall the incident. At around 12 noon today patient was with husband in the car as a passenger. Patient was repeating the same words and did not recall certain objects in the car. Patient was brought to the ER and had CT head which was unremarkable. Patient's symptoms lasted for around 45 minutes and resolved. Patient does not recall the incident. 6 weeks ago patient had a fall in a shopping center and patient at that time also does not recall how she fell. As per the husband patient did not have any loss of consciousness today. In the ER patient appeared nonfocal. On-call neurologist has been consulted and patient is being admitted for further management of possible transient global amnesia.    Hospital Course by Problem:   Transient global amnesia -  Patient appears to be back to her baseline.  MRI- Mild global atrophy typical of age.   1 cm nonspecific region of altered signal intensity within the  clivus. This may be an incidental finding as in retrospect, this may  have been present on 2007 CT examination although  difficult to state  with certainty. To confirm stability of this finding, follow-up  brain MR in 6-12 months may be considered (can be obtained sooner if  the patient develops headaches or cranial nerve symptoms).   Almost complete opacification left maxillary sinus containing  central complex material possibly representing inspissated proteinaceous material. -outpatient referral to ENT for evaluation EEG- normal  Mild hypercalcemia -  workup as outpatient.  Hypertension -  -resume home meds  Hyperlipidemia on statins.   Hypokalemia  -replaced   Medical Consultants:    Neuro   Discharge Exam:   Vitals:   09/25/16 0343 09/25/16 0556  BP: 132/69 138/86  Pulse: 61 61  Resp: 12 16  Temp: 97.9 F (36.6 C) 98.2 F (36.8 C)   Vitals:   09/24/16 2349 09/25/16 0143 09/25/16 0343 09/25/16 0556  BP: 139/69 121/70 132/69 138/86  Pulse: 62 65 61 61  Resp:  16 12 16   Temp: 98.8 F (37.1 C) 98.7 F (37.1 C) 97.9 F (36.6 C) 98.2 F (36.8 C)  TempSrc: Oral Oral Oral Oral  SpO2: 100% 98% 97% 100%  Weight:      Height:        Gen:  NAD- back to normal, able to relay neurology's recommendations    The results of significant diagnostics from this hospitalization (including imaging, microbiology, ancillary and laboratory) are listed below for reference.     Procedures and Diagnostic Studies:   Dg Chest 2 View  Result Date: 09/24/2016 CLINICAL DATA:  Altered mental status EXAM: CHEST  2 VIEW COMPARISON:  None.  FINDINGS: The heart size and mediastinal contours are within normal limits. Both lungs are clear. The visualized skeletal structures are unremarkable. IMPRESSION: No active cardiopulmonary disease. Electronically Signed   By: Gerome Sam III M.D   On: 09/24/2016 18:38   Ct Head Wo Contrast  Result Date: 09/24/2016 CLINICAL DATA:  Short-term memory loss EXAM: CT HEAD WITHOUT CONTRAST TECHNIQUE: Contiguous axial images were obtained from the base of the  skull through the vertex without intravenous contrast. COMPARISON:  09/26/2006 FINDINGS: Brain: No evidence of acute infarction, hemorrhage, hydrocephalus, extra-axial collection or mass lesion/mass effect. Vascular: No hyperdense vessel or unexpected calcification. Skull: Normal. Negative for fracture or focal lesion. Sinuses/Orbits: No acute finding. Other: None. IMPRESSION: No acute intracranial abnormality noted. Electronically Signed   By: Alcide Clever M.D.   On: 09/24/2016 17:35   Mr Brain Wo Contrast  Result Date: 09/25/2016 CLINICAL DATA:  70 year old female presenting with transient short-term memory loss. Subsequent encounter. EXAM: MRI HEAD WITHOUT CONTRAST TECHNIQUE: Multiplanar, multiecho pulse sequences of the brain and surrounding structures were obtained without intravenous contrast. COMPARISON:  09/24/2016 head CT. 09/26/2006 petrous temporal bone CT. FINDINGS: Brain: No acute infarct or intracranial hemorrhage. Mild global atrophy typical of age without evidence of hydrocephalus. 1 cm nonspecific region of altered signal intensity within the clivus. In retrospect, this may have been present on 2007 CT examination although difficult to state with certainty. To confirm stability of this finding, follow-up MR in 6-12 months may be considered. Vascular: Major intracranial vascular structures are patent. Skull and upper cervical spine: Cerebellar tonsils minimally low lying but within range of normal limits. Cervical canal appears congenitally small. Mild degenerative changes C1-2 articulation. Sinuses/Orbits: No acute orbital abnormality. Almost complete opacification left maxillary sinus containing central complex material possibly representing inspissated proteinaceous material. Minimal mucosal thickening right maxillary sinus. Mild mucosal thickening anterior ethmoid sinus air cells greater on left. Minimal mucosal thickening right sphenoid sinus. Partial opacification right mastoid air cells  without obstructing lesion of the right eustachian tube noted. Other: Negative IMPRESSION: No acute infarct or intracranial hemorrhage. Mild global atrophy typical of age. 1 cm nonspecific region of altered signal intensity within the clivus. This may be an incidental finding as in retrospect, this may have been present on 2007 CT examination although difficult to state with certainty. To confirm stability of this finding, follow-up brain MR in 6-12 months may be considered (can be obtained sooner if the patient develops headaches or cranial nerve symptoms). Almost complete opacification left maxillary sinus containing central complex material possibly representing inspissated proteinaceous material. Partial opacification right mastoid air cells. Electronically Signed   By: Lacy Duverney M.D.   On: 09/25/2016 07:39     Labs:   Basic Metabolic Panel:  Recent Labs Lab 09/24/16 1356 09/25/16 0332  NA 140 139  K 4.0 3.3*  CL 105 105  CO2 27 26  GLUCOSE 165* 96  BUN 14 11  CREATININE 0.46 0.60  CALCIUM 10.5* 9.8   GFR Estimated Creatinine Clearance: 45.7 mL/min (by C-G formula based on SCr of 0.6 mg/dL). Liver Function Tests:  Recent Labs Lab 09/24/16 1356 09/25/16 0332  AST 27 20  ALT 27 22  ALKPHOS 104 85  BILITOT 0.8 0.7  PROT 7.4 6.3*  ALBUMIN 4.3 3.5   No results for input(s): LIPASE, AMYLASE in the last 168 hours. No results for input(s): AMMONIA in the last 168 hours. Coagulation profile No results for input(s): INR, PROTIME in the last 168 hours.  CBC:  Recent Labs Lab 09/24/16 1356 09/25/16 0332  WBC 11.2* 9.0  HGB 14.6 13.2  HCT 44.5 40.7  MCV 94.9 95.1  PLT 243 221   Cardiac Enzymes: No results for input(s): CKTOTAL, CKMB, CKMBINDEX, TROPONINI in the last 168 hours. BNP: Invalid input(s): POCBNP CBG:  Recent Labs Lab 09/24/16 1355  GLUCAP 157*   D-Dimer No results for input(s): DDIMER in the last 72 hours. Hgb A1c No results for input(s):  HGBA1C in the last 72 hours. Lipid Profile No results for input(s): CHOL, HDL, LDLCALC, TRIG, CHOLHDL, LDLDIRECT in the last 72 hours. Thyroid function studies No results for input(s): TSH, T4TOTAL, T3FREE, THYROIDAB in the last 72 hours.  Invalid input(s): FREET3 Anemia work up No results for input(s): VITAMINB12, FOLATE, FERRITIN, TIBC, IRON, RETICCTPCT in the last 72 hours. Microbiology No results found for this or any previous visit (from the past 240 hour(s)).   Discharge Instructions:   Discharge Instructions    Diet - low sodium heart healthy    Complete by:  As directed    Discharge instructions    Complete by:  As directed    Outpatient referral to ENT re MRI- sinus opacification Repeat MRI in 6-12 months or sooner if HA/cranial nerve symptoms   Increase activity slowly    Complete by:  As directed      Allergies as of 09/25/2016   No Known Allergies     Medication List    TAKE these medications   bisoprolol-hydrochlorothiazide 5-6.25 MG tablet Commonly known as:  ZIAC 1 tablet by mouth daily.   cholecalciferol 1000 units tablet Commonly known as:  VITAMIN D Take 2,000 Units by mouth daily.   rosuvastatin 20 MG tablet Commonly known as:  CRESTOR 1 tablet Once a day by mouth 30 day(s)   TURMERIC PO Take 1 tablet by mouth daily.   vitamin C 500 MG tablet Commonly known as:  ASCORBIC ACID Take 500 mg by mouth daily.         Time coordinating discharge 35 min  Signed:  Christyann Manolis U Mitzi Lilja   [facility] 09/25/2016, 12:21 PM

## 2016-09-25 NOTE — Progress Notes (Signed)
Patient arrived to floor from ED with husband at bedside. Alert and oriented, no reports of pain. Patient to be fully assessed.

## 2016-09-25 NOTE — Procedures (Signed)
HPI: 70 y/o with transient confusion  TECHNICAL SUMMARY:  A multichannel referential and bipolar montage EEG using the standard international 10-20 system was performed on the patient described as awake, drowsy and asleep.  The dominant background activity consists of 10-11 hertz activity seen most prominantly over the posterior head region.  The backgound activity is reactive to eye opening and closing procedures.  Low voltage fast (beta) activity is distributed symmetrically and maximally over the anterior head regions.  ACTIVATION:  Stepwise photic stimulation at 4-20 flashes per second was performed and did not elicit any abnormal waveforms but did produce a symmetric driving response.  Hyperventilation was not performed.  EPILEPTIFORM ACTIVITY:  There were no spikes, sharp waves or paroxysmal activity.  SLEEP:  Both stage I and stage II sleep architecture were identified.  CARDIAC:  The EKG lead revealed a regular sinus rhythm.  IMPRESSION:  This is a normal EEG for the patients stated age.  There were no focal, hemispheric or lateralizing features.  No epileptiform activity was recorded.  A normal EEG does not exclude the diagnosis of a seizure disorder and if seizure remains high on the list of differential diagnosis, an ambulatory EEG may be of value.  Clinical correlation is required.

## 2016-09-25 NOTE — Care Management Obs Status (Signed)
MEDICARE OBSERVATION STATUS NOTIFICATION   Patient Details  Name: Patricia BushyHisae K Cheatwood MRN: [id number] Date of Birth: [birth year]   Medicare Observation Status Notification Given:  Yes    Epifanio LeschesCole, Tashay Bozich Hudson, RN 09/25/2016, 9:11 AM

## 2016-09-25 NOTE — Progress Notes (Signed)
Dr. Otelia LimesLindzen at bedside to assess patient.

## 2016-09-25 NOTE — Progress Notes (Signed)
EEG Completed; Results Pending  

## 2016-10-24 DIAGNOSIS — E782 Mixed hyperlipidemia: Secondary | ICD-10-CM | POA: Diagnosis not present

## 2016-10-24 DIAGNOSIS — I10 Essential (primary) hypertension: Secondary | ICD-10-CM | POA: Diagnosis not present

## 2016-10-24 DIAGNOSIS — J320 Chronic maxillary sinusitis: Secondary | ICD-10-CM | POA: Diagnosis not present

## 2016-10-24 DIAGNOSIS — R7303 Prediabetes: Secondary | ICD-10-CM | POA: Diagnosis not present

## 2016-11-08 DIAGNOSIS — J329 Chronic sinusitis, unspecified: Secondary | ICD-10-CM | POA: Diagnosis not present

## 2017-03-13 ENCOUNTER — Other Ambulatory Visit: Payer: Self-pay | Admitting: Family Medicine

## 2017-03-13 DIAGNOSIS — Z1231 Encounter for screening mammogram for malignant neoplasm of breast: Secondary | ICD-10-CM

## 2017-03-21 ENCOUNTER — Ambulatory Visit
Admission: RE | Admit: 2017-03-21 | Discharge: 2017-03-21 | Disposition: A | Discharge status: Home / Self Care | Payer: Medicare PPO | Source: Ambulatory Visit | Attending: Family Medicine | Admitting: Family Medicine

## 2017-03-21 DIAGNOSIS — Z1231 Encounter for screening mammogram for malignant neoplasm of breast: Secondary | ICD-10-CM

## 2017-05-23 DIAGNOSIS — Z1211 Encounter for screening for malignant neoplasm of colon: Secondary | ICD-10-CM | POA: Diagnosis not present

## 2017-05-23 DIAGNOSIS — R7303 Prediabetes: Secondary | ICD-10-CM | POA: Diagnosis not present

## 2017-05-23 DIAGNOSIS — I10 Essential (primary) hypertension: Secondary | ICD-10-CM | POA: Diagnosis not present

## 2017-05-23 DIAGNOSIS — E782 Mixed hyperlipidemia: Secondary | ICD-10-CM | POA: Diagnosis not present

## 2017-05-23 DIAGNOSIS — Z13820 Encounter for screening for osteoporosis: Secondary | ICD-10-CM | POA: Diagnosis not present

## 2017-05-25 IMAGING — CT CT HEAD W/O CM
4 series · 19 of 47 positions shown, 21 images · non-contrast
Comparison: 09/26/2006

CLINICAL DATA: Short-term memory loss

EXAM:
CT HEAD WITHOUT CONTRAST
TECHNIQUE: Contiguous axial images were obtained from the base of the skull
through the vertex without intravenous contrast.

[Series 201: head w/o, idose (1) · axial · non-contrast · 0.39mm/px · z∈[+73,+183]mm · 8 of 30 slices shown, 10 images]
[im 4/30  brain]
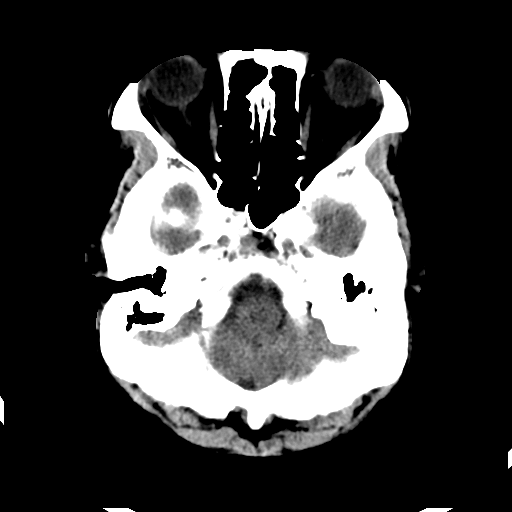
[im 4/30  bone]
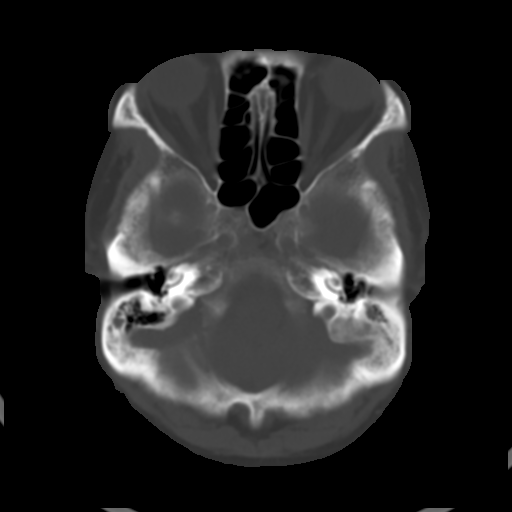
[im 7/30  brain]
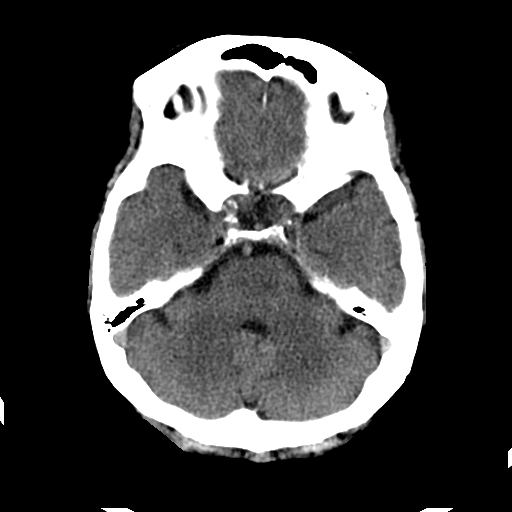
[im 10/30  brain]
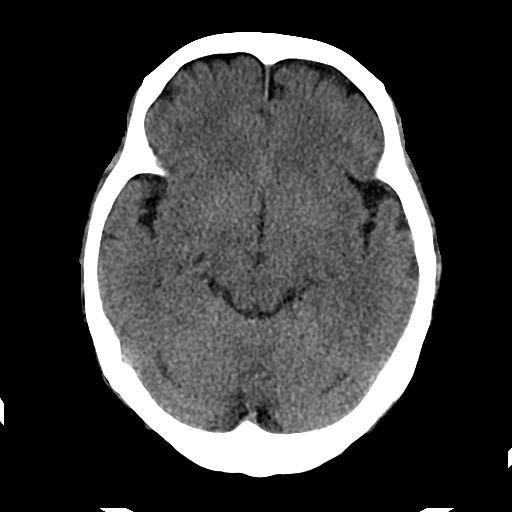
[im 13/30  brain]
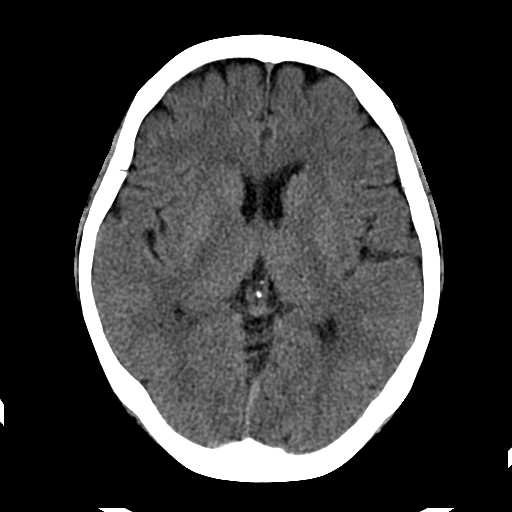
[im 17/30  brain]
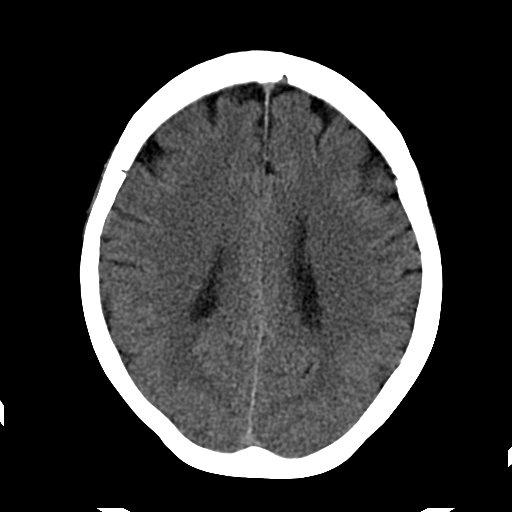
[im 17/30  bone]
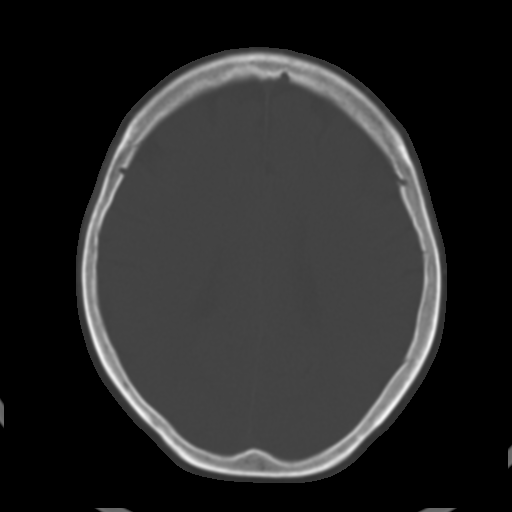
[im 20/30  brain]
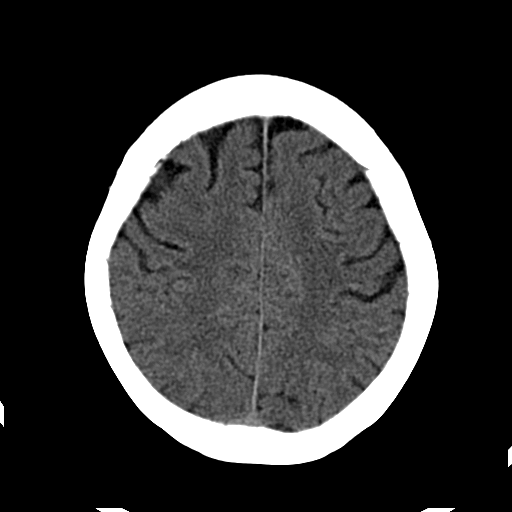
[im 23/30  brain]
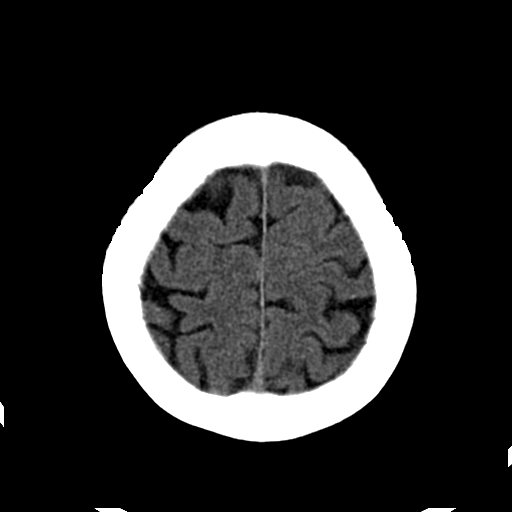
[im 26/30  brain]
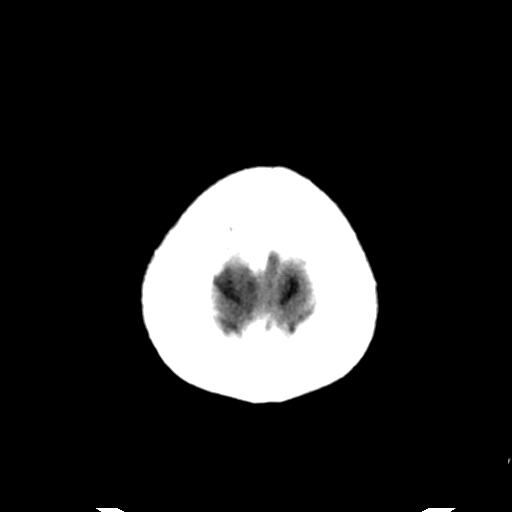

[Series 202: head w/o bone, idose (1) · axial · non-contrast · 0.39mm/px · z∈[+71,+141]mm · 5 of 60 slices shown]
[im 7/60  bone]
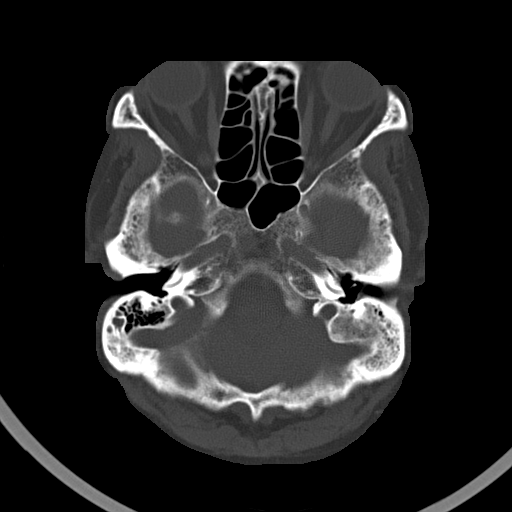
[im 13/60  bone]
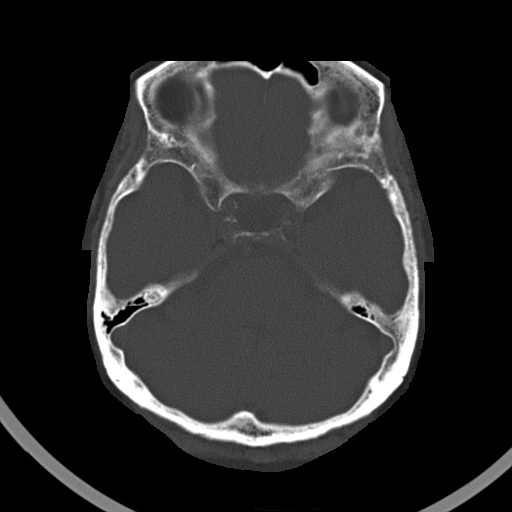
[im 19/60  bone]
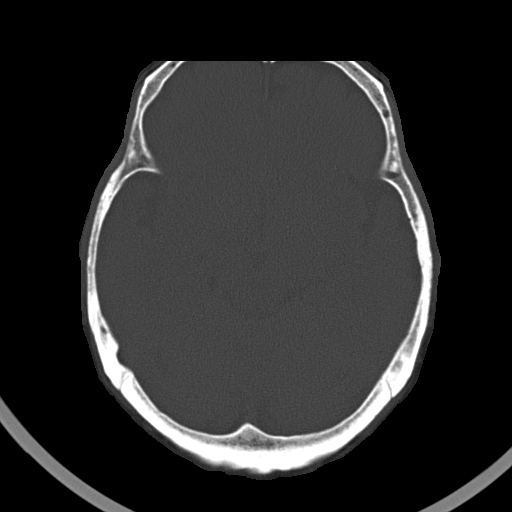
[im 25/60  bone]
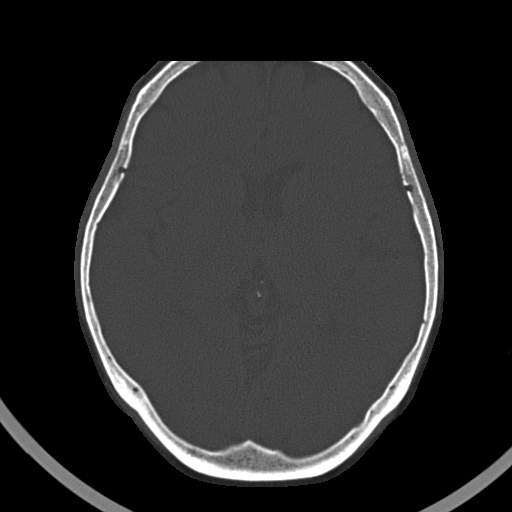
[im 35/60  bone]
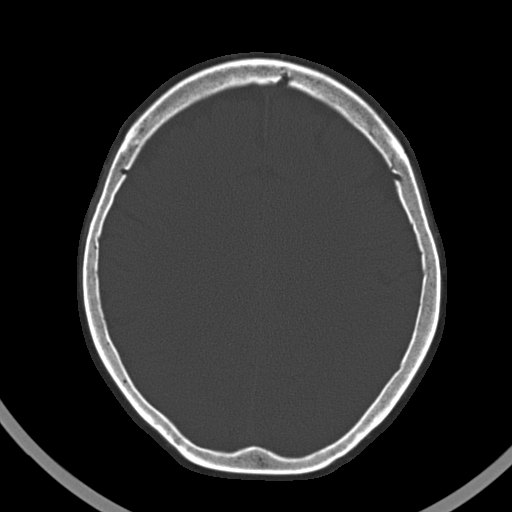

[Series 203: coronal st, idose (1) · coronal · 0.39mm/px · 3 of 63 slices shown]
[im 21/63  brain]
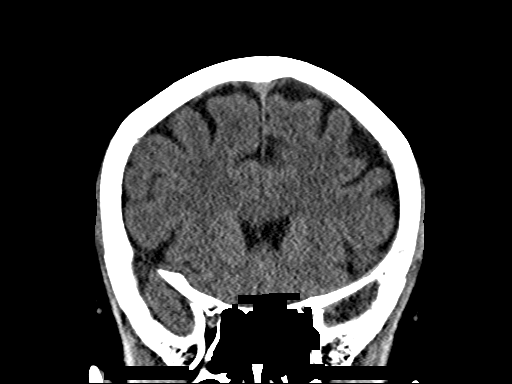
[im 28/63  brain]
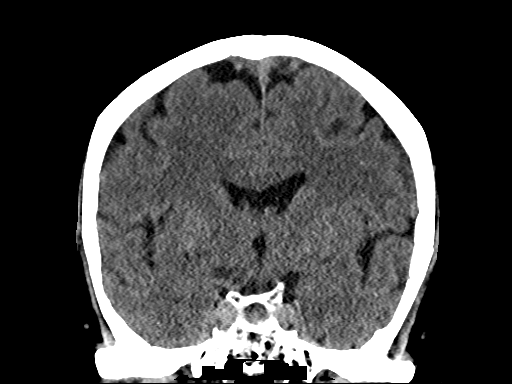
[im 35/63  brain]
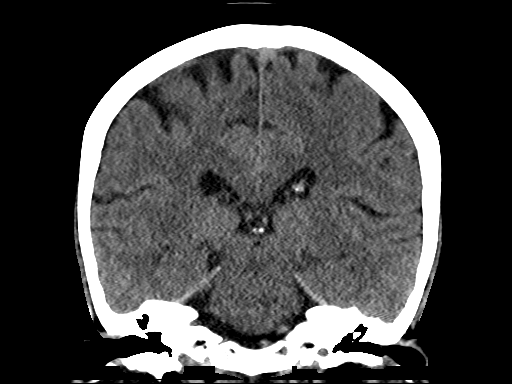

[Series 204: sagittal st, idose (1) · sagittal · 0.39mm/px · 3 of 63 slices shown]
[im 21/63  brain]
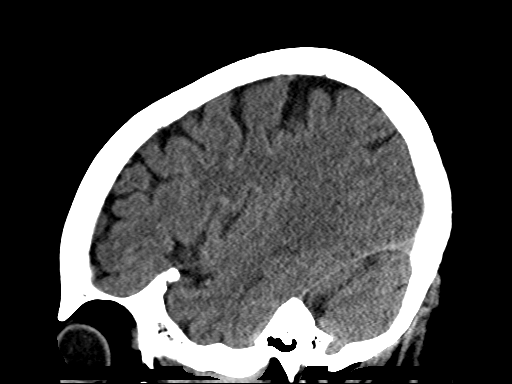
[im 32/63  brain]
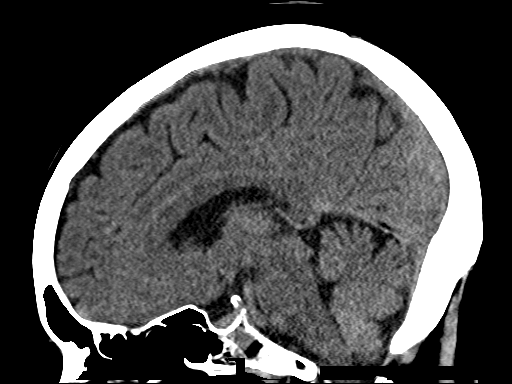
[im 42/63  brain]
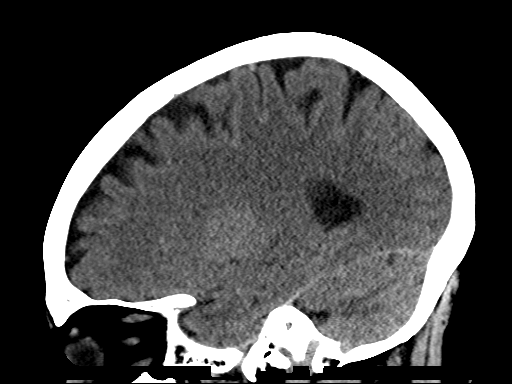

[19 of 47 positions shown; findings below may reference images not displayed]

FINDINGS: Brain: No evidence of acute infarction, hemorrhage, hydrocephalus,
extra-axial collection or mass lesion/mass effect.

Vascular: No hyperdense vessel or unexpected calcification.

Skull: Normal. Negative for fracture or focal lesion.

Sinuses/Orbits: No acute finding.

Other: None.
IMPRESSION: No acute intracranial abnormality noted.

## 2017-05-25 IMAGING — CR DG CHEST 2V
2 series · 2 of 2 positions shown · non-contrast
Comparison: None.

CLINICAL DATA: Altered mental status

EXAM:
CHEST  2 VIEW

[chest lat]
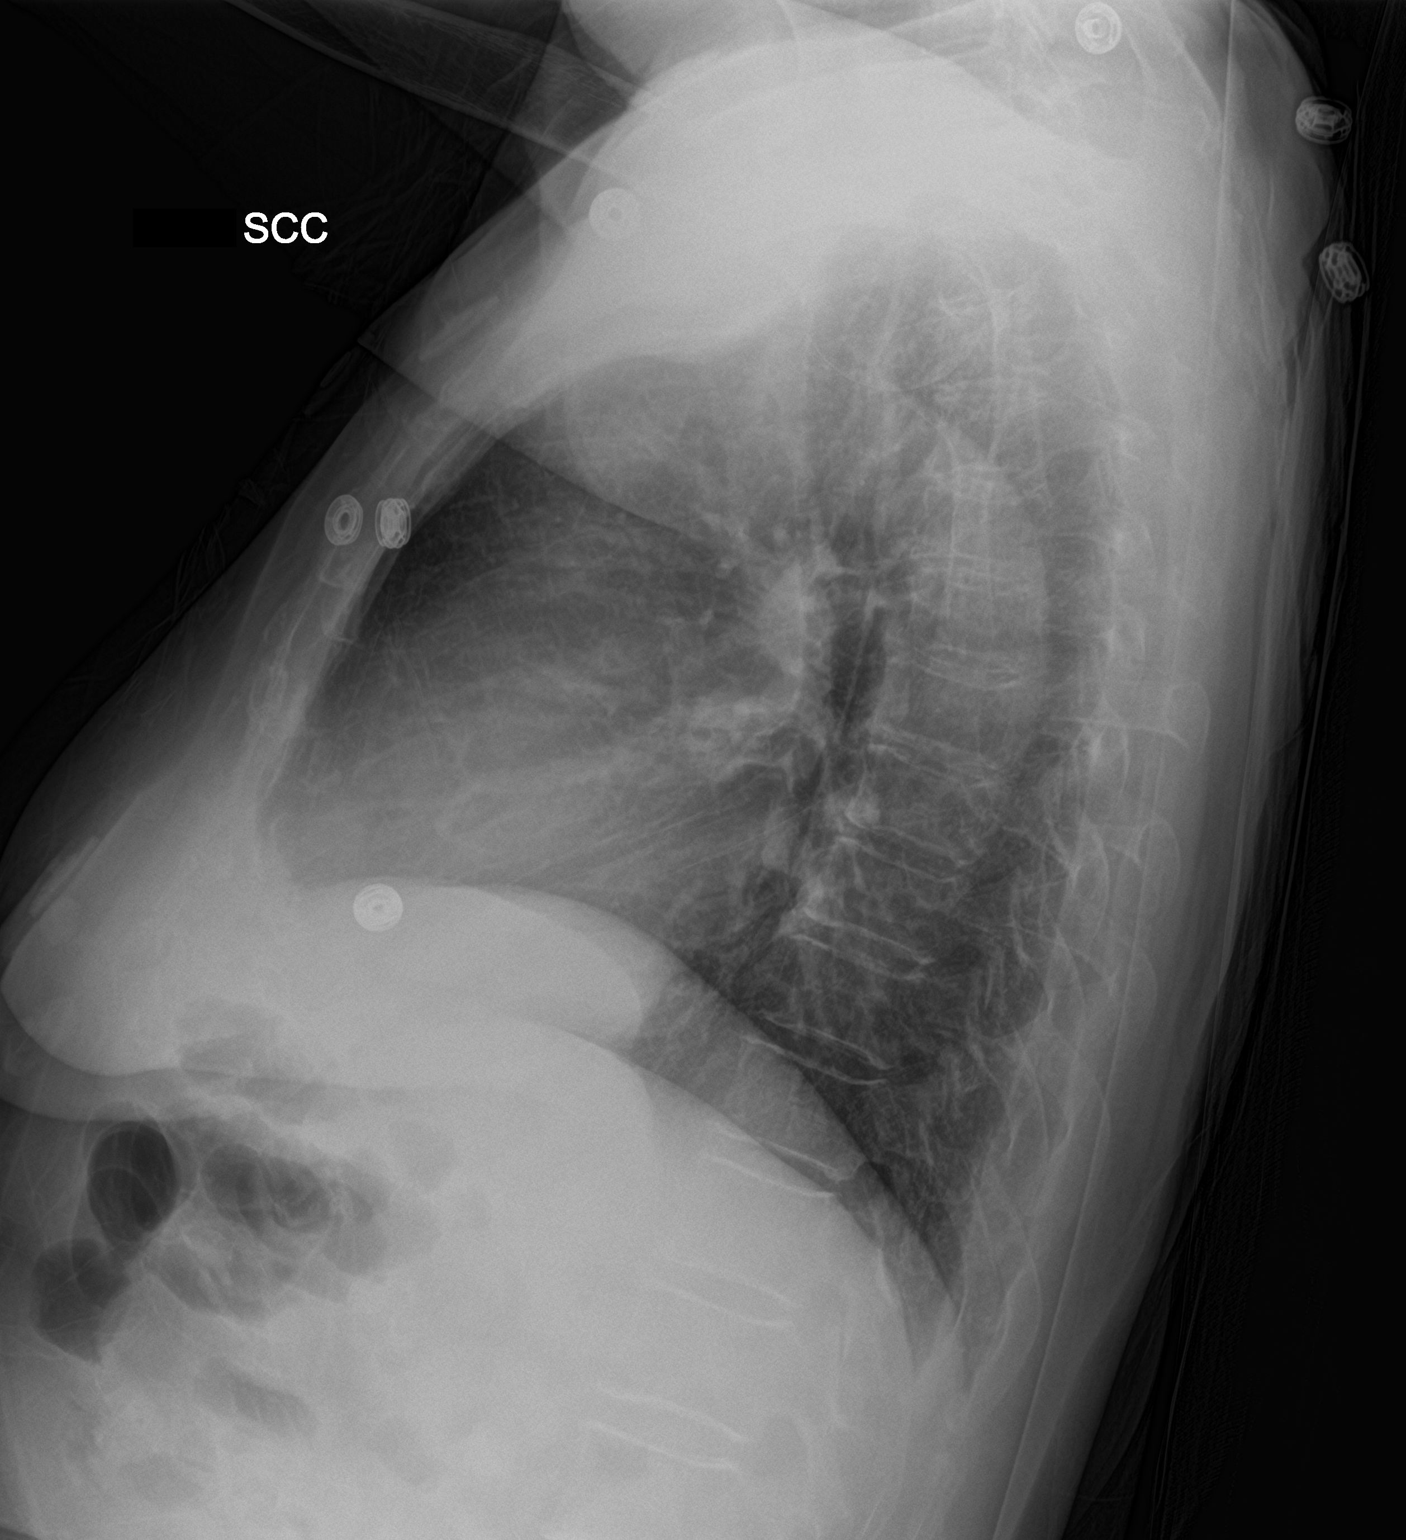

[chest ap]
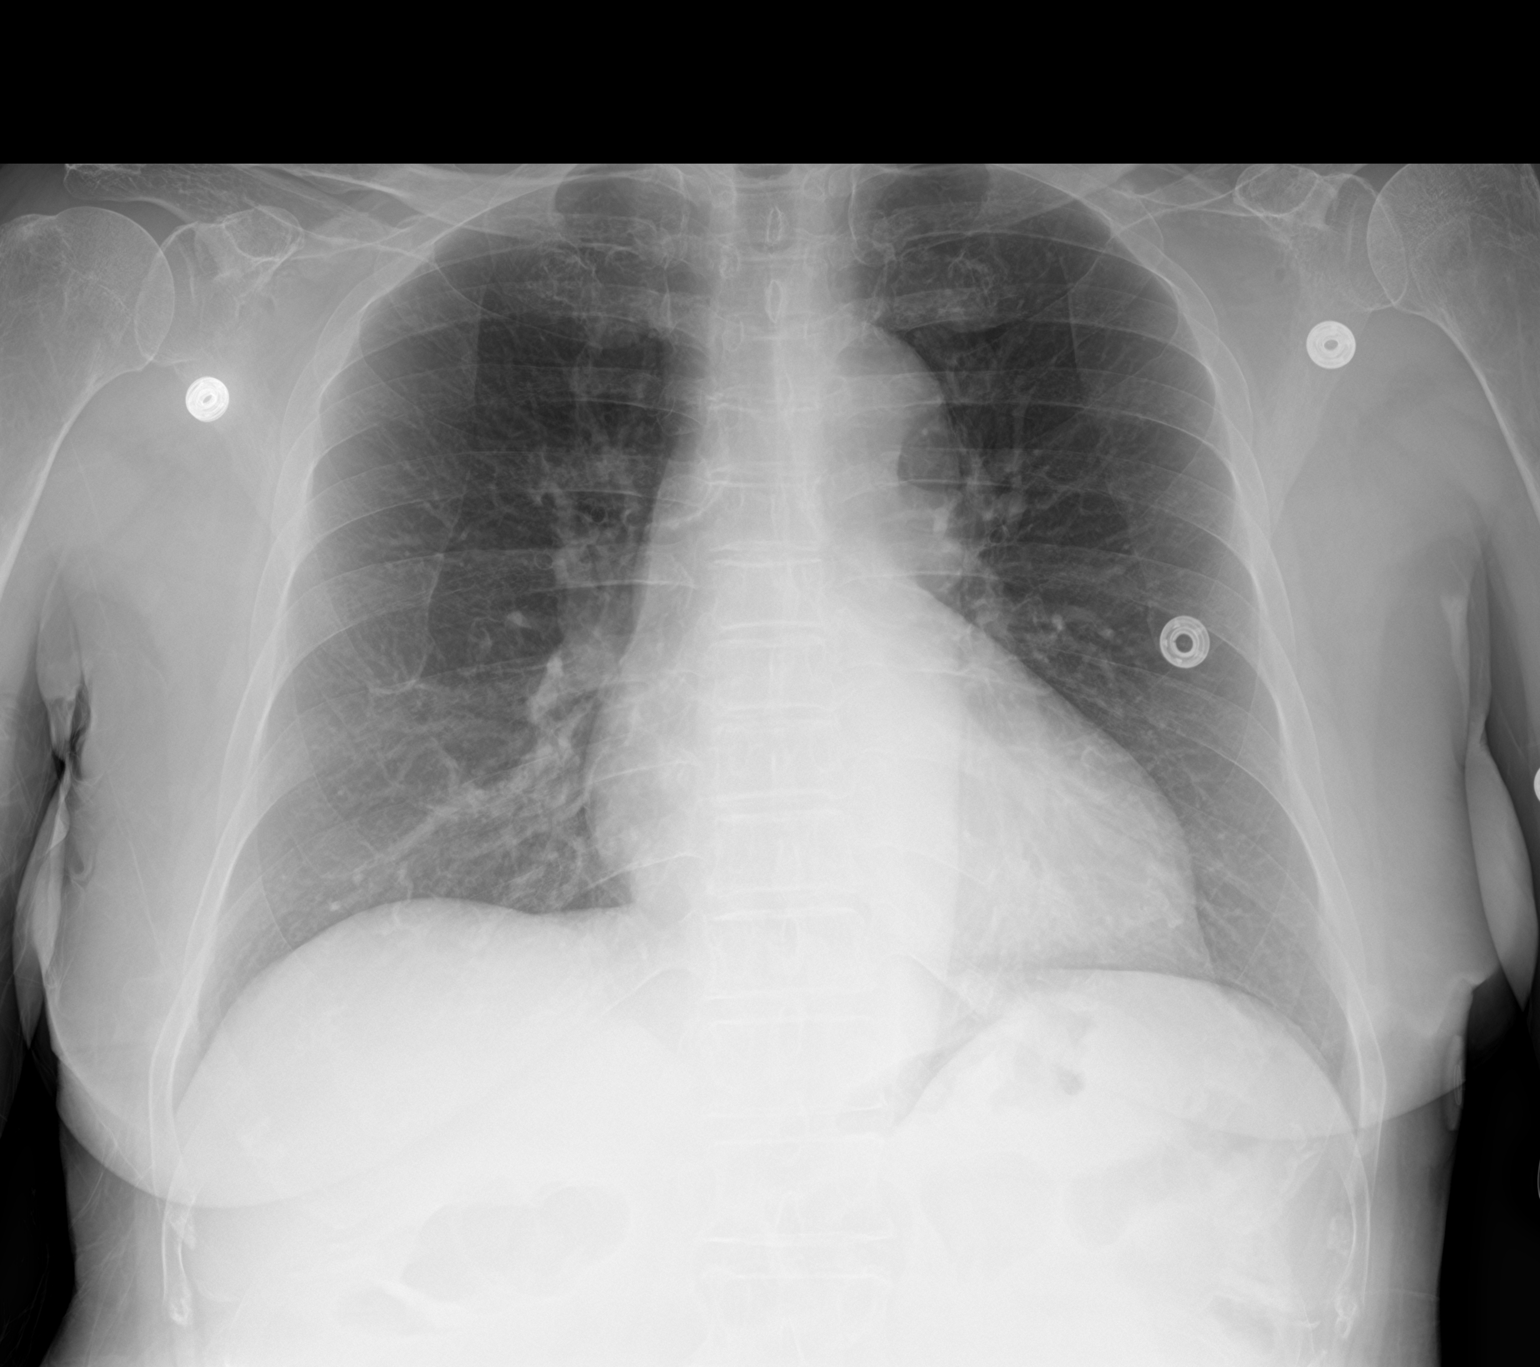

[2 of 2 positions shown; findings below may reference images not displayed]

FINDINGS: The heart size and mediastinal contours are within normal limits.
Both lungs are clear. The visualized skeletal structures are
unremarkable.
IMPRESSION: No active cardiopulmonary disease.

## 2017-09-05 DIAGNOSIS — Z23 Encounter for immunization: Secondary | ICD-10-CM | POA: Diagnosis not present

## 2017-09-05 DIAGNOSIS — E119 Type 2 diabetes mellitus without complications: Secondary | ICD-10-CM | POA: Diagnosis not present

## 2018-03-13 ENCOUNTER — Other Ambulatory Visit: Payer: Self-pay | Admitting: Family Medicine

## 2018-03-13 DIAGNOSIS — Z1231 Encounter for screening mammogram for malignant neoplasm of breast: Secondary | ICD-10-CM

## 2018-04-16 ENCOUNTER — Ambulatory Visit: Payer: Medicare PPO

## 2018-04-16 ENCOUNTER — Ambulatory Visit
Admission: RE | Admit: 2018-04-16 | Discharge: 2018-04-16 | Disposition: A | Discharge status: Home / Self Care | Payer: Medicare PPO | Source: Ambulatory Visit | Attending: Family Medicine | Admitting: Family Medicine

## 2018-04-16 DIAGNOSIS — Z1231 Encounter for screening mammogram for malignant neoplasm of breast: Secondary | ICD-10-CM

## 2018-05-30 DIAGNOSIS — L0101 Non-bullous impetigo: Secondary | ICD-10-CM | POA: Diagnosis not present

## 2018-05-30 DIAGNOSIS — L0109 Other impetigo: Secondary | ICD-10-CM | POA: Diagnosis not present

## 2018-05-30 DIAGNOSIS — R21 Rash and other nonspecific skin eruption: Secondary | ICD-10-CM | POA: Diagnosis not present

## 2018-05-30 DIAGNOSIS — L308 Other specified dermatitis: Secondary | ICD-10-CM | POA: Diagnosis not present

## 2018-05-30 DIAGNOSIS — D485 Neoplasm of uncertain behavior of skin: Secondary | ICD-10-CM | POA: Diagnosis not present

## 2018-06-14 DIAGNOSIS — L509 Urticaria, unspecified: Secondary | ICD-10-CM | POA: Diagnosis not present

## 2018-07-30 DIAGNOSIS — E1169 Type 2 diabetes mellitus with other specified complication: Secondary | ICD-10-CM | POA: Diagnosis not present

## 2018-07-30 DIAGNOSIS — E782 Mixed hyperlipidemia: Secondary | ICD-10-CM | POA: Diagnosis not present

## 2018-07-30 DIAGNOSIS — I10 Essential (primary) hypertension: Secondary | ICD-10-CM | POA: Diagnosis not present

## 2018-07-30 DIAGNOSIS — Z1211 Encounter for screening for malignant neoplasm of colon: Secondary | ICD-10-CM | POA: Diagnosis not present

## 2018-07-30 DIAGNOSIS — Z23 Encounter for immunization: Secondary | ICD-10-CM | POA: Diagnosis not present

## 2019-07-24 DIAGNOSIS — Z23 Encounter for immunization: Secondary | ICD-10-CM | POA: Diagnosis not present

## 2020-03-30 DIAGNOSIS — I10 Essential (primary) hypertension: Secondary | ICD-10-CM | POA: Diagnosis not present

## 2020-03-30 DIAGNOSIS — E1169 Type 2 diabetes mellitus with other specified complication: Secondary | ICD-10-CM | POA: Diagnosis not present

## 2020-03-30 DIAGNOSIS — E782 Mixed hyperlipidemia: Secondary | ICD-10-CM | POA: Diagnosis not present

## 2020-09-16 DIAGNOSIS — Z23 Encounter for immunization: Secondary | ICD-10-CM | POA: Diagnosis not present

## 2021-05-24 DIAGNOSIS — I10 Essential (primary) hypertension: Secondary | ICD-10-CM | POA: Diagnosis not present

## 2021-05-24 DIAGNOSIS — E1169 Type 2 diabetes mellitus with other specified complication: Secondary | ICD-10-CM | POA: Diagnosis not present

## 2021-05-24 DIAGNOSIS — E782 Mixed hyperlipidemia: Secondary | ICD-10-CM | POA: Diagnosis not present

## 2021-08-16 DIAGNOSIS — Z23 Encounter for immunization: Secondary | ICD-10-CM | POA: Diagnosis not present

## 2021-12-06 DIAGNOSIS — E782 Mixed hyperlipidemia: Secondary | ICD-10-CM | POA: Diagnosis not present

## 2021-12-06 DIAGNOSIS — I10 Essential (primary) hypertension: Secondary | ICD-10-CM | POA: Diagnosis not present

## 2021-12-06 DIAGNOSIS — E785 Hyperlipidemia, unspecified: Secondary | ICD-10-CM | POA: Diagnosis not present

## 2021-12-06 DIAGNOSIS — E1169 Type 2 diabetes mellitus with other specified complication: Secondary | ICD-10-CM | POA: Diagnosis not present

## 2022-03-08 DIAGNOSIS — E782 Mixed hyperlipidemia: Secondary | ICD-10-CM | POA: Diagnosis not present

## 2022-03-08 DIAGNOSIS — I10 Essential (primary) hypertension: Secondary | ICD-10-CM | POA: Diagnosis not present

## 2022-06-28 DIAGNOSIS — E782 Mixed hyperlipidemia: Secondary | ICD-10-CM | POA: Diagnosis not present

## 2022-06-28 DIAGNOSIS — Z0000 Encounter for general adult medical examination without abnormal findings: Secondary | ICD-10-CM | POA: Diagnosis not present

## 2022-06-28 DIAGNOSIS — E1169 Type 2 diabetes mellitus with other specified complication: Secondary | ICD-10-CM | POA: Diagnosis not present

## 2022-06-28 DIAGNOSIS — Z1331 Encounter for screening for depression: Secondary | ICD-10-CM | POA: Diagnosis not present

## 2022-06-28 DIAGNOSIS — Z23 Encounter for immunization: Secondary | ICD-10-CM | POA: Diagnosis not present

## 2022-06-28 DIAGNOSIS — I10 Essential (primary) hypertension: Secondary | ICD-10-CM | POA: Diagnosis not present

## 2022-06-28 DIAGNOSIS — Z1211 Encounter for screening for malignant neoplasm of colon: Secondary | ICD-10-CM | POA: Diagnosis not present

## 2022-09-28 DIAGNOSIS — H35361 Drusen (degenerative) of macula, right eye: Secondary | ICD-10-CM | POA: Diagnosis not present

## 2022-09-28 DIAGNOSIS — D3131 Benign neoplasm of right choroid: Secondary | ICD-10-CM | POA: Diagnosis not present

## 2022-09-28 DIAGNOSIS — H40013 Open angle with borderline findings, low risk, bilateral: Secondary | ICD-10-CM | POA: Diagnosis not present

## 2022-09-28 DIAGNOSIS — H25013 Cortical age-related cataract, bilateral: Secondary | ICD-10-CM | POA: Diagnosis not present

## 2022-09-28 DIAGNOSIS — H2513 Age-related nuclear cataract, bilateral: Secondary | ICD-10-CM | POA: Diagnosis not present

## 2022-09-28 DIAGNOSIS — H524 Presbyopia: Secondary | ICD-10-CM | POA: Diagnosis not present

## 2023-01-03 DIAGNOSIS — E782 Mixed hyperlipidemia: Secondary | ICD-10-CM | POA: Diagnosis not present

## 2023-01-03 DIAGNOSIS — E1169 Type 2 diabetes mellitus with other specified complication: Secondary | ICD-10-CM | POA: Diagnosis not present

## 2023-01-03 DIAGNOSIS — I10 Essential (primary) hypertension: Secondary | ICD-10-CM | POA: Diagnosis not present

## 2023-01-03 DIAGNOSIS — D3131 Benign neoplasm of right choroid: Secondary | ICD-10-CM | POA: Diagnosis not present

## 2023-01-03 DIAGNOSIS — E1136 Type 2 diabetes mellitus with diabetic cataract: Secondary | ICD-10-CM | POA: Diagnosis not present

## 2023-01-03 DIAGNOSIS — H35369 Drusen (degenerative) of macula, unspecified eye: Secondary | ICD-10-CM | POA: Diagnosis not present

## 2023-03-30 DIAGNOSIS — H40013 Open angle with borderline findings, low risk, bilateral: Secondary | ICD-10-CM | POA: Diagnosis not present

## 2023-03-30 DIAGNOSIS — H40033 Anatomical narrow angle, bilateral: Secondary | ICD-10-CM | POA: Diagnosis not present

## 2023-03-30 DIAGNOSIS — D3131 Benign neoplasm of right choroid: Secondary | ICD-10-CM | POA: Diagnosis not present

## 2023-05-02 DIAGNOSIS — H40013 Open angle with borderline findings, low risk, bilateral: Secondary | ICD-10-CM | POA: Diagnosis not present

## 2023-05-02 DIAGNOSIS — H40033 Anatomical narrow angle, bilateral: Secondary | ICD-10-CM | POA: Diagnosis not present

## 2023-05-02 DIAGNOSIS — H40032 Anatomical narrow angle, left eye: Secondary | ICD-10-CM | POA: Diagnosis not present

## 2023-05-29 DIAGNOSIS — H40032 Anatomical narrow angle, left eye: Secondary | ICD-10-CM | POA: Diagnosis not present

## 2023-11-12 DIAGNOSIS — E119 Type 2 diabetes mellitus without complications: Secondary | ICD-10-CM | POA: Diagnosis not present

## 2023-11-12 DIAGNOSIS — E782 Mixed hyperlipidemia: Secondary | ICD-10-CM | POA: Diagnosis not present

## 2023-11-12 DIAGNOSIS — Z23 Encounter for immunization: Secondary | ICD-10-CM | POA: Diagnosis not present

## 2023-11-12 DIAGNOSIS — Z1331 Encounter for screening for depression: Secondary | ICD-10-CM | POA: Diagnosis not present

## 2023-11-12 DIAGNOSIS — Z0000 Encounter for general adult medical examination without abnormal findings: Secondary | ICD-10-CM | POA: Diagnosis not present

## 2023-11-12 DIAGNOSIS — I10 Essential (primary) hypertension: Secondary | ICD-10-CM | POA: Diagnosis not present

## 2024-02-22 NOTE — Progress Notes (Signed)
 [facility] [address] [place] [address] Phone: [phone] Subjective:   Patricia Nielsen, am serving as a scribe for Dr. Ronnell Coins.  I'm seeing this patient by the request  of:  Faustina Hood, MD  CC: bilateral knee pain   [id number]  Patricia Nielsen is a 78 y.o. female coming in with complaint of B knee pain. Patient states that her pain is anterior. Pain occurring for past 3 weeks. Pain is intermittent. Has tried ice and bracing.      Past Medical History:  Diagnosis Date   Hypertension    No past surgical history on file. Social History   Socioeconomic History   Marital status: Married    Spouse name: Not on file   Number of children: Not on file   Years of education: Not on file   Highest education level: Not on file  Occupational History   Not on file  Tobacco Use   Smoking status: Never   Smokeless tobacco: Never  Substance and Sexual Activity   Alcohol use: Not on file   Drug use: Not on file   Sexual activity: Not on file  Other Topics Concern   Not on file  Social History Narrative   Not on file   Social Drivers of Health   Financial Resource Strain: Not on file  Food Insecurity: Not on file  Transportation Needs: Not on file  Physical Activity: Not on file  Stress: Not on file  Social Connections: Not on file   No Known Allergies Family History  Problem Relation Age of Onset   Diabetes Mellitus II Neg Hx    CAD Neg Hx    Stroke Neg Hx      Current Outpatient Medications (Cardiovascular):    bisoprolol -hydrochlorothiazide (ZIAC) 5-6.25 MG tablet, 1 tablet by mouth daily.   rosuvastatin  (CRESTOR ) 20 MG tablet, 1 tablet Once a day by mouth 30 day(s)     Current Outpatient Medications (Other):    cholecalciferol (VITAMIN D) 1000 units tablet, Take 2,000 Units by mouth daily.   TURMERIC PO, Take 1 tablet by mouth daily.   vitamin C (ASCORBIC ACID) 500 MG tablet, Take 500 mg by mouth  daily.   Reviewed prior external information including notes and imaging from  primary care provider As well as notes that were available from care everywhere and other healthcare systems.  Past medical history, social, surgical and family history all reviewed in electronic medical record.  No pertanent information unless stated regarding to the chief complaint.   Review of Systems:  No headache, visual changes, nausea, vomiting, diarrhea, constipation, dizziness, abdominal pain, skin rash, fevers, chills, night sweats, weight loss, swollen lymph nodes, body aches, joint swelling, chest pain, shortness of breath, mood changes. POSITIVE muscle aches  Objective  There were no vitals taken for this visit.   General: No apparent distress alert and oriented x3 mood and affect normal, dressed appropriately.  HEENT: Pupils equal, extraocular movements intact  Respiratory: Patient's speak in full sentences and does not appear short of breath  Cardiovascular: No lower extremity edema, non tender, no erythema  Knee exam shows left knee does have an effusion noted.  No significant swelling noted.  Patient does have no warmness to touch.  No skin breakdown.  Procedure: Real-time Ultrasound Guided Injection of left knee Device: GE Logiq Q7 Ultrasound guided injection is preferred based studies that show increased duration, increased effect, greater accuracy, decreased procedural pain, increased response rate,  and decreased cost with ultrasound guided versus blind injection.  Verbal informed consent obtained.  Time-out conducted.  Noted no overlying erythema, induration, or other signs of local infection.  Skin prepped in a sterile fashion.  Local anesthesia: Topical Ethyl chloride.  With sterile technique and under real time ultrasound guidance: With a 22-gauge 2 inch needle patient was injected with 4 cc of 0.5% Marcaine and aspirated 45 cc of cloudy fluid.  We then injected 1 cc of Kenalog 40  mg/dL. This was from a superior lateral approach.  Completed without difficulty  Pain immediately resolved suggesting accurate placement of the medication.  Advised to call if fevers/chills, erythema, induration, drainage, or persistent bleeding.  Images permanently stored  Impression: Technically successful ultrasound guided injection.     Impression and Recommendations:     The above documentation has been reviewed and is accurate and complete Patricia Nielsen M Patricia Albert, DO

## 2024-02-25 ENCOUNTER — Encounter: Payer: Self-pay | Admitting: Family Medicine

## 2024-02-25 ENCOUNTER — Other Ambulatory Visit: Payer: Self-pay

## 2024-02-25 ENCOUNTER — Ambulatory Visit: Admitting: Family Medicine

## 2024-02-25 ENCOUNTER — Ambulatory Visit (INDEPENDENT_AMBULATORY_CARE_PROVIDER_SITE_OTHER)

## 2024-02-25 VITALS — BP 144/84 | HR 64 | Ht 60.0 in | Wt 112.0 lb

## 2024-02-25 DIAGNOSIS — M65962 Unspecified synovitis and tenosynovitis, left lower leg: Secondary | ICD-10-CM | POA: Insufficient documentation

## 2024-02-25 DIAGNOSIS — M25561 Pain in right knee: Secondary | ICD-10-CM | POA: Diagnosis not present

## 2024-02-25 DIAGNOSIS — M25562 Pain in left knee: Secondary | ICD-10-CM

## 2024-02-25 LAB — SYNOVIAL FLUID ANALYSIS, COMPLETE
Basophils, %: 0 %
Eosinophils-Synovial: 0 % (ref 0–2)
Lymphocytes-Synovial Fld: 37 % (ref 0–74)
Monocyte/Macrophage: 52 % (ref 0–69)
Neutrophil, Synovial: 11 % (ref 0–24)
Synoviocytes, %: 0 % (ref 0–15)
WBC, Synovial: 269 {cells}/uL — ABNORMAL HIGH (ref <150)

## 2024-02-25 MED ORDER — DOXYCYCLINE HYCLATE 100 MG PO TABS: 100.0000 mg | ORAL_TABLET | Freq: Two times a day (BID) | ORAL | 0 refills | Status: AC

## 2024-02-25 NOTE — Assessment & Plan Note (Signed)
 Patient is fluid that is consistent with a potential infection.  Does state that she may have had some type of fever previously.  May have even had a tick bite.  Cover with doxycycline  until further evaluation of the fluid that is noted.  Discussed icing regimen otherwise, no x-rays to further evaluate the underlying arthritis that can be contributing.  I doubt what is within the differential as well.  Follow-up again in 6 to 8 weeks.

## 2024-02-25 NOTE — Patient Instructions (Addendum)
 Drained knee today Doxy 2x a day for 3 weeks Xray on way out See me in 3 weeks Red swollen or painful seek medical attention

## 2024-03-05 NOTE — Progress Notes (Signed)
 [facility] [address] [place] [address] Phone: [phone] Subjective:   IBryan Caprio, am serving as a scribe for Dr. Ronnell Coins.  I'm seeing this patient by the request  of:  Faustina Hood, MD  CC: Left knee pain follow  [id number]  02/25/2024 Patient is fluid that is consistent with a potential infection.  Does state that she may have had some type of fever previously.  May have even had a tick bite.  Cover with doxycycline  until further evaluation of the fluid that is noted.  Discussed icing regimen otherwise, no x-rays to further evaluate the underlying arthritis that can be contributing.  I doubt what is within the differential as well.  Follow-up again in 6 to 8 weeks.     Update 03/13/2024 Patricia Nielsen is a 78 y.o. female coming in with complaint of L knee pain.  Patient was seen and there was some signs and symptoms consistent consistent with a synovitis of the left knee.  Patient states knee is hurting. Medicine didn't help. Pain is still there and about the same.  Xray L knee 02/25/2024 MPRESSION: Minor peripheral spurring, less than typically seen for age.      Past Medical History:  Diagnosis Date   Hypertension    No past surgical history on file. Social History   Socioeconomic History   Marital status: Married    Spouse name: Not on file   Number of children: Not on file   Years of education: Not on file   Highest education level: Not on file  Occupational History   Not on file  Tobacco Use   Smoking status: Never   Smokeless tobacco: Never  Substance and Sexual Activity   Alcohol use: Not on file   Drug use: Not on file   Sexual activity: Not on file  Other Topics Concern   Not on file  Social History Narrative   Not on file   Social Drivers of Health   Financial Resource Strain: Not on file  Food Insecurity: Not on file  Transportation Needs: Not on file  Physical Activity: Not on file   Stress: Not on file  Social Connections: Not on file   No Known Allergies Family History  Problem Relation Age of Onset   Diabetes Mellitus II Neg Hx    CAD Neg Hx    Stroke Neg Hx      Current Outpatient Medications (Cardiovascular):    bisoprolol -hydrochlorothiazide (ZIAC) 5-6.25 MG tablet, 1 tablet by mouth daily.   rosuvastatin  (CRESTOR ) 20 MG tablet, 1 tablet Once a day by mouth 30 day(s)     Current Outpatient Medications (Other):    cholecalciferol (VITAMIN D) 1000 units tablet, Take 2,000 Units by mouth daily.   doxycycline  (VIBRA -TABS) 100 MG tablet, Take 1 tablet (100 mg total) by mouth 2 (two) times daily.   TURMERIC PO, Take 1 tablet by mouth daily.   vitamin C (ASCORBIC ACID) 500 MG tablet, Take 500 mg by mouth daily.   Reviewed prior external information including notes and imaging from  primary care provider As well as notes that were available from care everywhere and other healthcare systems.  Past medical history, social, surgical and family history all reviewed in electronic medical record.  No pertanent information unless stated regarding to the chief complaint.   Review of Systems:  No headache, visual changes, nausea, vomiting, diarrhea, constipation, dizziness, abdominal pain, skin rash, fevers, chills, night sweats, weight loss, swollen  lymph nodes, body aches, joint swelling, chest pain, shortness of breath, mood changes. POSITIVE muscle aches  Objective  Blood pressure 132/86, pulse 67, height 5' (1.524 m), weight 120 lb (54.4 kg), SpO2 98%.   General: No apparent distress alert and oriented x3 mood and affect normal, dressed appropriately.  HEENT: Pupils equal, extraocular movements intact  Respiratory: Patient's speak in full sentences and does not appear short of breath  Cardiovascular: No lower extremity edema, non tender, no erythema  Left knee exam shows significant swelling has returned already.  Patient lacks last 15 degrees of flexion  secondary to this.  No erythema of the skin though noted.  Limited muscular skeletal ultrasound was performed and interpreted by Ronnell Coins, M  Limited ultrasound shows the patient does still have the significant synovitis noted.  Hypoechoic changes in the patellofemoral joint noted.  No loose bodies noted.    Impression and Recommendations:    The above documentation has been reviewed and is accurate and complete Patricia Nielsen M Patricia Bordeaux, DO

## 2024-03-13 ENCOUNTER — Encounter: Payer: Self-pay | Admitting: Family Medicine

## 2024-03-13 ENCOUNTER — Other Ambulatory Visit: Payer: Self-pay

## 2024-03-13 ENCOUNTER — Ambulatory Visit: Admitting: Family Medicine

## 2024-03-13 ENCOUNTER — Telehealth: Payer: Self-pay | Admitting: Family Medicine

## 2024-03-13 VITALS — BP 132/86 | HR 67 | Ht 60.0 in | Wt 120.0 lb

## 2024-03-13 DIAGNOSIS — M65962 Unspecified synovitis and tenosynovitis, left lower leg: Secondary | ICD-10-CM | POA: Diagnosis not present

## 2024-03-13 DIAGNOSIS — M25562 Pain in left knee: Secondary | ICD-10-CM

## 2024-03-13 NOTE — Telephone Encounter (Signed)
 Put in order for [employer]. Left message with patient

## 2024-03-13 NOTE — Telephone Encounter (Signed)
 Patient's husband called and she was referred for an MRI in [place] but they can not schedule there any time soon. They would like to check the [facility] location Dr. Felipe Horton suggested and call there to schedule. Please advise as to which location he can call.

## 2024-03-13 NOTE — Assessment & Plan Note (Signed)
 Recurrent synovitis of the left knee noted.  Discussed with patient at this point with this as well as the instability I do feel advanced imaging is warranted.  Depending on findings we will see if we can medically manage the possibility of needing surgical intervention.  Affecting all daily activities.  MRI ordered today to further evaluate cause of recurrent swelling and synovitis with increasing instability.

## 2024-03-13 NOTE — Patient Instructions (Addendum)
 [facility] [phone] Call Today  When we receive your results we will contact you.  Knee will continue to be painful. If the pain becomes worse seek medical attention

## 2024-03-15 ENCOUNTER — Ambulatory Visit

## 2024-03-15 DIAGNOSIS — M25562 Pain in left knee: Secondary | ICD-10-CM

## 2024-03-15 DIAGNOSIS — M1712 Unilateral primary osteoarthritis, left knee: Secondary | ICD-10-CM | POA: Diagnosis not present

## 2024-03-15 DIAGNOSIS — S83232A Complex tear of medial meniscus, current injury, left knee, initial encounter: Secondary | ICD-10-CM | POA: Diagnosis not present

## 2024-03-15 DIAGNOSIS — M25462 Effusion, left knee: Secondary | ICD-10-CM | POA: Diagnosis not present

## 2024-03-15 DIAGNOSIS — M65962 Unspecified synovitis and tenosynovitis, left lower leg: Secondary | ICD-10-CM | POA: Diagnosis not present

## 2024-03-21 ENCOUNTER — Ambulatory Visit: Payer: Self-pay | Admitting: Family Medicine

## 2024-03-21 ENCOUNTER — Other Ambulatory Visit: Payer: Self-pay

## 2024-03-21 DIAGNOSIS — M25562 Pain in left knee: Secondary | ICD-10-CM

## 2024-03-24 NOTE — Progress Notes (Signed)
 Spoke with patient per MRI result. Referral placed.

## 2024-03-28 ENCOUNTER — Other Ambulatory Visit

## 2024-04-03 DIAGNOSIS — S83242A Other tear of medial meniscus, current injury, left knee, initial encounter: Secondary | ICD-10-CM | POA: Diagnosis not present

## 2024-05-30 DIAGNOSIS — G8918 Other acute postprocedural pain: Secondary | ICD-10-CM | POA: Diagnosis not present

## 2024-05-30 DIAGNOSIS — S83242A Other tear of medial meniscus, current injury, left knee, initial encounter: Secondary | ICD-10-CM | POA: Diagnosis not present

## 2024-05-30 DIAGNOSIS — M94262 Chondromalacia, left knee: Secondary | ICD-10-CM | POA: Diagnosis not present

## 2024-06-10 DIAGNOSIS — S83242A Other tear of medial meniscus, current injury, left knee, initial encounter: Secondary | ICD-10-CM | POA: Diagnosis not present

## 2024-06-11 DIAGNOSIS — M25662 Stiffness of left knee, not elsewhere classified: Secondary | ICD-10-CM | POA: Diagnosis not present

## 2024-06-11 DIAGNOSIS — R531 Weakness: Secondary | ICD-10-CM | POA: Diagnosis not present

## 2024-06-16 DIAGNOSIS — M25662 Stiffness of left knee, not elsewhere classified: Secondary | ICD-10-CM | POA: Diagnosis not present

## 2024-06-16 DIAGNOSIS — R531 Weakness: Secondary | ICD-10-CM | POA: Diagnosis not present

## 2024-06-18 DIAGNOSIS — R531 Weakness: Secondary | ICD-10-CM | POA: Diagnosis not present

## 2024-06-18 DIAGNOSIS — M25662 Stiffness of left knee, not elsewhere classified: Secondary | ICD-10-CM | POA: Diagnosis not present

## 2024-06-23 DIAGNOSIS — R531 Weakness: Secondary | ICD-10-CM | POA: Diagnosis not present

## 2024-06-23 DIAGNOSIS — M25662 Stiffness of left knee, not elsewhere classified: Secondary | ICD-10-CM | POA: Diagnosis not present

## 2024-06-30 DIAGNOSIS — M25662 Stiffness of left knee, not elsewhere classified: Secondary | ICD-10-CM | POA: Diagnosis not present

## 2024-06-30 DIAGNOSIS — R531 Weakness: Secondary | ICD-10-CM | POA: Diagnosis not present

## 2024-07-09 DIAGNOSIS — R531 Weakness: Secondary | ICD-10-CM | POA: Diagnosis not present

## 2024-07-09 DIAGNOSIS — M25662 Stiffness of left knee, not elsewhere classified: Secondary | ICD-10-CM | POA: Diagnosis not present
